# Patient Record
Sex: Male | Born: 1955 | Race: White | Hispanic: No | Marital: Married | Smoking: Current every day smoker
Health system: Southern US, Community
[De-identification: ages and names within clinical notes are randomized; demographics above are authoritative.]

## PROBLEM LIST (undated history)

## (undated) DIAGNOSIS — D126 Benign neoplasm of colon, unspecified: Secondary | ICD-10-CM

## (undated) DIAGNOSIS — L409 Psoriasis, unspecified: Secondary | ICD-10-CM

## (undated) DIAGNOSIS — N400 Enlarged prostate without lower urinary tract symptoms: Secondary | ICD-10-CM

## (undated) HISTORY — DX: Benign prostatic hyperplasia without lower urinary tract symptoms: N40.0

## (undated) HISTORY — DX: Psoriasis, unspecified: L40.9

## (undated) HISTORY — DX: Benign neoplasm of colon, unspecified: D12.6

---

## 2004-02-10 ENCOUNTER — Other Ambulatory Visit: Payer: Self-pay

## 2004-02-11 ENCOUNTER — Other Ambulatory Visit: Payer: Self-pay

## 2008-03-09 DIAGNOSIS — D126 Benign neoplasm of colon, unspecified: Secondary | ICD-10-CM

## 2008-03-09 HISTORY — DX: Benign neoplasm of colon, unspecified: D12.6

## 2008-03-09 HISTORY — PX: COLONOSCOPY: SHX174

## 2008-03-12 ENCOUNTER — Ambulatory Visit: Payer: Self-pay | Admitting: Gastroenterology

## 2008-03-12 LAB — HM COLONOSCOPY: HM Colonoscopy: NORMAL

## 2011-04-01 ENCOUNTER — Ambulatory Visit: Payer: Self-pay | Admitting: Urology

## 2011-06-24 ENCOUNTER — Ambulatory Visit: Payer: Self-pay | Admitting: Urology

## 2011-07-06 ENCOUNTER — Ambulatory Visit: Payer: Self-pay | Admitting: Urology

## 2011-07-14 ENCOUNTER — Ambulatory Visit: Payer: Self-pay | Admitting: Urology

## 2011-07-16 ENCOUNTER — Ambulatory Visit: Payer: Self-pay | Admitting: Urology

## 2011-07-23 ENCOUNTER — Ambulatory Visit: Payer: Self-pay | Admitting: Urology

## 2011-07-30 ENCOUNTER — Ambulatory Visit: Payer: Self-pay | Admitting: Urology

## 2011-08-06 ENCOUNTER — Ambulatory Visit: Payer: Self-pay | Admitting: Urology

## 2011-08-10 ENCOUNTER — Ambulatory Visit: Payer: Self-pay | Admitting: Urology

## 2011-08-18 ENCOUNTER — Ambulatory Visit: Payer: Self-pay | Admitting: Urology

## 2016-03-16 DIAGNOSIS — L409 Psoriasis, unspecified: Secondary | ICD-10-CM | POA: Insufficient documentation

## 2016-03-16 DIAGNOSIS — N400 Enlarged prostate without lower urinary tract symptoms: Secondary | ICD-10-CM | POA: Insufficient documentation

## 2016-04-14 ENCOUNTER — Encounter: Payer: Self-pay | Admitting: Family Medicine

## 2016-04-14 ENCOUNTER — Ambulatory Visit (INDEPENDENT_AMBULATORY_CARE_PROVIDER_SITE_OTHER): Payer: 59 | Admitting: Family Medicine

## 2016-04-14 VITALS — BP 132/86 | HR 78 | Temp 98.8°F | Ht 69.6 in | Wt 182.0 lb

## 2016-04-14 DIAGNOSIS — Z0000 Encounter for general adult medical examination without abnormal findings: Secondary | ICD-10-CM | POA: Diagnosis not present

## 2016-04-14 LAB — URINALYSIS, ROUTINE W REFLEX MICROSCOPIC
BILIRUBIN UA: NEGATIVE
Glucose, UA: NEGATIVE
Ketones, UA: NEGATIVE
LEUKOCYTES UA: NEGATIVE
Nitrite, UA: NEGATIVE
PH UA: 5.5 (ref 5.0–7.5)
Specific Gravity, UA: 1.025 (ref 1.005–1.030)
Urobilinogen, Ur: 1 mg/dL (ref 0.2–1.0)

## 2016-04-14 LAB — MICROSCOPIC EXAMINATION: Epithelial Cells (non renal): NONE SEEN /hpf (ref 0–10)

## 2016-04-14 MED ORDER — SILDENAFIL CITRATE 20 MG PO TABS: 20.0000 mg | ORAL_TABLET | Freq: Every day | ORAL | Status: DC | PRN

## 2016-04-14 MED ORDER — SILDENAFIL CITRATE 20 MG PO TABS
20.0000 mg | ORAL_TABLET | Freq: Every day | ORAL | Status: DC | PRN
Start: 2016-04-14 — End: 2016-04-14

## 2016-04-14 NOTE — Progress Notes (Signed)
BP 132/86 mmHg  Pulse 78  Temp(Src) 98.8 F (37.1 C)  Ht 5' 9.6" (1.768 m)  Wt 182 lb (82.555 kg)  BMI 26.41 kg/m2  SpO2 96%   Subjective:    Patient ID: Trevor Lopez, male    DOB: 02-25-1956, 60 y.o.   MRN: [id number]  HPI: Trevor Lopez is a 60 y.o. male  Chief Complaint  Patient presents with  . Annual Exam  Patient with 2 episodes of bad bronchitis this winter is come to the realization's time to quit smoking. His biggest obstacle now is his wife who continues to smoke at home in the house. He is now down to 2 cigarettes a day doing well. We discussed Chantix had some bad vivid dreams previously on that medication.   Relevant past medical, surgical, family and social history reviewed and updated as indicated. Interim medical history since our last visit reviewed. Allergies and medications reviewed and updated.  Review of Systems  Constitutional: Negative.   HENT: Negative.   Eyes: Negative.   Respiratory: Positive for cough. Negative for shortness of breath.   Cardiovascular: Negative.  Negative for chest pain and palpitations.  Gastrointestinal: Negative.   Endocrine: Negative.   Genitourinary: Negative.   Musculoskeletal: Negative.   Skin: Negative.   Allergic/Immunologic: Negative.   Neurological: Negative.   Hematological: Negative.   Psychiatric/Behavioral: Negative.     Per HPI unless specifically indicated above     Objective:    BP 132/86 mmHg  Pulse 78  Temp(Src) 98.8 F (37.1 C)  Ht 5' 9.6" (1.768 m)  Wt 182 lb (82.555 kg)  BMI 26.41 kg/m2  SpO2 96%  Wt Readings from Last 3 Encounters:  04/14/16 182 lb (82.555 kg)  08/22/14 172 lb (78.019 kg)    Physical Exam  Constitutional: He is oriented to person, place, and time. He appears well-developed and well-nourished.  HENT:  Head: Normocephalic and atraumatic.  Right Ear: External ear normal.  Left Ear: External ear normal.  Eyes: Conjunctivae and EOM are normal. Pupils are equal, round, and  reactive to light.  Neck: Normal range of motion. Neck supple.  Cardiovascular: Normal rate, regular rhythm, normal heart sounds and intact distal pulses.   Pulmonary/Chest: Effort normal and breath sounds normal.  Abdominal: Soft. Bowel sounds are normal. There is no splenomegaly or hepatomegaly.  Genitourinary: Rectum normal, prostate normal and penis normal.  Musculoskeletal: Normal range of motion.  Neurological: He is alert and oriented to person, place, and time. He has normal reflexes.  Skin: No rash noted. No erythema.  Psychiatric: He has a normal mood and affect. His behavior is normal. Judgment and thought content normal.    Results for orders placed or performed in visit on 03/16/16  [facility] COLONOSCOPY  Result Value Ref Range   [facility] Colonoscopy Patient Reported Normal See Report, Patient Reported Normal      Assessment & Plan:   Problem List Items Addressed This Visit    None    Visit Diagnoses    Routine general medical examination at a health care facility    -  Primary    Relevant Orders    CBC with Differential/Platelet    Comprehensive metabolic panel    Lipid Panel w/o Chol/HDL Ratio    PSA    TSH    Urinalysis, Routine w reflex microscopic (not at [facility])      discussed smoking and stopping  Follow up plan: Return if symptoms worsen or fail to improve.

## 2016-04-15 ENCOUNTER — Telehealth: Payer: Self-pay | Admitting: Family Medicine

## 2016-04-15 DIAGNOSIS — R7989 Other specified abnormal findings of blood chemistry: Secondary | ICD-10-CM

## 2016-04-15 DIAGNOSIS — R972 Elevated prostate specific antigen [PSA]: Secondary | ICD-10-CM

## 2016-04-15 DIAGNOSIS — D751 Secondary polycythemia: Secondary | ICD-10-CM

## 2016-04-15 LAB — COMPREHENSIVE METABOLIC PANEL
A/G RATIO: 1.5 (ref 1.2–2.2)
ALK PHOS: 114 IU/L (ref 39–117)
ALT: 41 IU/L (ref 0–44)
AST: 29 IU/L (ref 0–40)
Albumin: 4.1 g/dL (ref 3.5–5.5)
BUN/Creatinine Ratio: 9 (ref 9–20)
BUN: 9 mg/dL (ref 6–24)
Bilirubin Total: 0.7 mg/dL (ref 0.0–1.2)
CALCIUM: 9.5 mg/dL (ref 8.7–10.2)
CHLORIDE: 98 mmol/L (ref 96–106)
CO2: 22 mmol/L (ref 18–29)
Creatinine, Ser: 0.95 mg/dL (ref 0.76–1.27)
GFR calc Af Amer: 101 mL/min/{1.73_m2} (ref >59)
GFR, EST NON AFRICAN AMERICAN: 87 mL/min/{1.73_m2} (ref >59)
GLOBULIN, TOTAL: 2.7 g/dL (ref 1.5–4.5)
Glucose: 95 mg/dL (ref 65–99)
POTASSIUM: 4.7 mmol/L (ref 3.5–5.2)
SODIUM: 140 mmol/L (ref 134–144)
Total Protein: 6.8 g/dL (ref 6.0–8.5)

## 2016-04-15 LAB — CBC WITH DIFFERENTIAL/PLATELET
BASOS: 0 %
Basophils Absolute: 0 10*3/uL (ref 0.0–0.2)
EOS (ABSOLUTE): 0.2 10*3/uL (ref 0.0–0.4)
Eos: 2 %
HEMATOCRIT: 51 % (ref 37.5–51.0)
Hemoglobin: 18.1 g/dL — ABNORMAL HIGH (ref 12.6–17.7)
IMMATURE GRANULOCYTES: 0 %
Immature Grans (Abs): 0 10*3/uL (ref 0.0–0.1)
LYMPHS ABS: 1.7 10*3/uL (ref 0.7–3.1)
Lymphs: 25 %
MCH: 35.7 pg — ABNORMAL HIGH (ref 26.6–33.0)
MCHC: 35.5 g/dL (ref 31.5–35.7)
MCV: 101 fL — AB (ref 79–97)
MONOS ABS: 0.8 10*3/uL (ref 0.1–0.9)
Monocytes: 13 %
NEUTROS PCT: 60 %
Neutrophils Absolute: 4 10*3/uL (ref 1.4–7.0)
PLATELETS: 204 10*3/uL (ref 150–379)
RBC: 5.07 x10E6/uL (ref 4.14–5.80)
RDW: 15.8 % — AB (ref 12.3–15.4)
WBC: 6.7 10*3/uL (ref 3.4–10.8)

## 2016-04-15 LAB — LIPID PANEL W/O CHOL/HDL RATIO
CHOLESTEROL TOTAL: 145 mg/dL (ref 100–199)
HDL: 43 mg/dL (ref >39)
LDL Calculated: 75 mg/dL (ref 0–99)
TRIGLYCERIDES: 137 mg/dL (ref 0–149)
VLDL Cholesterol Cal: 27 mg/dL (ref 5–40)

## 2016-04-15 LAB — PSA: Prostate Specific Ag, Serum: 5.1 ng/mL — ABNORMAL HIGH (ref 0.0–4.0)

## 2016-04-15 LAB — TSH: TSH: <0.006 u[IU]/mL — ABNORMAL LOW (ref 0.450–4.500)

## 2016-04-15 NOTE — Telephone Encounter (Signed)
Phone call Discussed with patient elevated hemoglobin. Discussed ramifications with smoking and need for smoking cessation smoking outside and secondhand smoke patient indicates he understands and will try to comply Elevated PSA no prostate symptoms Suppressed TSH no thyroid symptoms Will need to recheck PSA, TSH, CBC 1 month

## 2016-05-08 ENCOUNTER — Other Ambulatory Visit: Payer: 59

## 2016-05-08 DIAGNOSIS — R7989 Other specified abnormal findings of blood chemistry: Secondary | ICD-10-CM

## 2016-05-08 DIAGNOSIS — R972 Elevated prostate specific antigen [PSA]: Secondary | ICD-10-CM

## 2016-05-08 DIAGNOSIS — D751 Secondary polycythemia: Secondary | ICD-10-CM

## 2016-05-09 LAB — CBC WITH DIFFERENTIAL/PLATELET
Basophils Absolute: 0 10*3/uL (ref 0.0–0.2)
Basos: 1 %
EOS (ABSOLUTE): 0.2 10*3/uL (ref 0.0–0.4)
EOS: 3 %
HEMATOCRIT: 52.9 % — AB (ref 37.5–51.0)
HEMOGLOBIN: 18.2 g/dL — AB (ref 12.6–17.7)
Immature Grans (Abs): 0 10*3/uL (ref 0.0–0.1)
Immature Granulocytes: 0 %
LYMPHS ABS: 1.8 10*3/uL (ref 0.7–3.1)
Lymphs: 31 %
MCH: 35.9 pg — AB (ref 26.6–33.0)
MCHC: 34.4 g/dL (ref 31.5–35.7)
MCV: 104 fL — ABNORMAL HIGH (ref 79–97)
MONOCYTES: 14 %
Monocytes Absolute: 0.8 10*3/uL (ref 0.1–0.9)
NEUTROS ABS: 2.9 10*3/uL (ref 1.4–7.0)
Neutrophils: 51 %
Platelets: 196 10*3/uL (ref 150–379)
RBC: 5.07 x10E6/uL (ref 4.14–5.80)
RDW: 15.6 % — ABNORMAL HIGH (ref 12.3–15.4)
WBC: 5.7 10*3/uL (ref 3.4–10.8)

## 2016-05-09 LAB — TSH: TSH: <0.006 u[IU]/mL — ABNORMAL LOW (ref 0.450–4.500)

## 2016-05-09 LAB — PSA: Prostate Specific Ag, Serum: 4 ng/mL (ref 0.0–4.0)

## 2016-05-11 ENCOUNTER — Telehealth: Payer: Self-pay | Admitting: Family Medicine

## 2016-05-11 DIAGNOSIS — E0590 Thyrotoxicosis, unspecified without thyrotoxic crisis or storm: Secondary | ICD-10-CM

## 2016-05-11 NOTE — Telephone Encounter (Addendum)
Will check on how long it will take to get into endocrine for his newly diagnosed hyperthyroid. If quick, will leave alone. If not, will consider starting methimazole, obtain thyroid ultrasound and NM uptake scan.

## 2016-05-13 NOTE — Telephone Encounter (Signed)
Perfect. Then I'll hold on anything further. Thanks!

## 2016-05-13 NOTE — Telephone Encounter (Signed)
Called endo. They said they have the referral and was going to call the patient in just a few minutes to schedule an appointment.

## 2016-05-13 NOTE — Telephone Encounter (Signed)
Any word on this one??

## 2016-05-18 ENCOUNTER — Encounter: Payer: Self-pay | Admitting: Family Medicine

## 2016-06-03 ENCOUNTER — Other Ambulatory Visit: Payer: Self-pay | Admitting: Internal Medicine

## 2016-06-03 DIAGNOSIS — E0590 Thyrotoxicosis, unspecified without thyrotoxic crisis or storm: Secondary | ICD-10-CM

## 2016-06-11 ENCOUNTER — Encounter
Admission: RE | Admit: 2016-06-11 | Discharge: 2016-06-11 | Disposition: A | Discharge status: Home / Self Care | Payer: 59 | Source: Ambulatory Visit | Attending: Internal Medicine | Admitting: Internal Medicine

## 2016-06-11 DIAGNOSIS — E0590 Thyrotoxicosis, unspecified without thyrotoxic crisis or storm: Secondary | ICD-10-CM | POA: Diagnosis present

## 2016-06-11 MED ORDER — SODIUM IODIDE I-123 3.7 MBQ PO CAPS
158.2000 | ORAL_CAPSULE | Freq: Once | ORAL | Status: AC
  Administered 2016-06-11: 158.2 via ORAL

## 2016-06-12 ENCOUNTER — Encounter
Admission: RE | Admit: 2016-06-12 | Discharge: 2016-06-12 | Disposition: A | Discharge status: Home / Self Care | Payer: 59 | Source: Ambulatory Visit | Attending: Internal Medicine | Admitting: Internal Medicine

## 2018-02-01 ENCOUNTER — Other Ambulatory Visit: Payer: Self-pay | Admitting: Urology

## 2018-02-01 DIAGNOSIS — N201 Calculus of ureter: Secondary | ICD-10-CM

## 2018-02-09 ENCOUNTER — Ambulatory Visit: Admission: RE | Admit: 2018-02-09 | Payer: 59 | Source: Ambulatory Visit

## 2018-02-16 ENCOUNTER — Ambulatory Visit: Admission: RE | Admit: 2018-02-16 | Payer: 59 | Source: Ambulatory Visit

## 2019-06-23 ENCOUNTER — Inpatient Hospital Stay
Admission: EM | Admit: 2019-06-23 | Discharge: 2019-07-11 | DRG: 432 | Disposition: E | Payer: 59 | Attending: Internal Medicine | Admitting: Internal Medicine

## 2019-06-23 ENCOUNTER — Inpatient Hospital Stay: Payer: 59

## 2019-06-23 ENCOUNTER — Other Ambulatory Visit: Payer: Self-pay

## 2019-06-23 ENCOUNTER — Emergency Department: Payer: 59

## 2019-06-23 DIAGNOSIS — K922 Gastrointestinal hemorrhage, unspecified: Secondary | ICD-10-CM | POA: Diagnosis not present

## 2019-06-23 DIAGNOSIS — E872 Acidosis: Secondary | ICD-10-CM | POA: Diagnosis present

## 2019-06-23 DIAGNOSIS — Y92009 Unspecified place in unspecified non-institutional (private) residence as the place of occurrence of the external cause: Secondary | ICD-10-CM

## 2019-06-23 DIAGNOSIS — I8511 Secondary esophageal varices with bleeding: Secondary | ICD-10-CM | POA: Diagnosis present

## 2019-06-23 DIAGNOSIS — Z79899 Other long term (current) drug therapy: Secondary | ICD-10-CM

## 2019-06-23 DIAGNOSIS — E162 Hypoglycemia, unspecified: Secondary | ICD-10-CM | POA: Diagnosis present

## 2019-06-23 DIAGNOSIS — R34 Anuria and oliguria: Secondary | ICD-10-CM | POA: Diagnosis present

## 2019-06-23 DIAGNOSIS — R296 Repeated falls: Secondary | ICD-10-CM | POA: Diagnosis present

## 2019-06-23 DIAGNOSIS — Z66 Do not resuscitate: Secondary | ICD-10-CM | POA: Diagnosis not present

## 2019-06-23 DIAGNOSIS — J9601 Acute respiratory failure with hypoxia: Secondary | ICD-10-CM | POA: Diagnosis present

## 2019-06-23 DIAGNOSIS — K7030 Alcoholic cirrhosis of liver without ascites: Principal | ICD-10-CM | POA: Diagnosis present

## 2019-06-23 DIAGNOSIS — R402434 Glasgow coma scale score 3-8, 24 hours or more after hospital admission: Secondary | ICD-10-CM | POA: Diagnosis not present

## 2019-06-23 DIAGNOSIS — T829XXA Unspecified complication of cardiac and vascular prosthetic device, implant and graft, initial encounter: Secondary | ICD-10-CM

## 2019-06-23 DIAGNOSIS — E039 Hypothyroidism, unspecified: Secondary | ICD-10-CM | POA: Diagnosis present

## 2019-06-23 DIAGNOSIS — N170 Acute kidney failure with tubular necrosis: Secondary | ICD-10-CM | POA: Diagnosis present

## 2019-06-23 DIAGNOSIS — R571 Hypovolemic shock: Secondary | ICD-10-CM | POA: Diagnosis present

## 2019-06-23 DIAGNOSIS — G9341 Metabolic encephalopathy: Secondary | ICD-10-CM | POA: Diagnosis present

## 2019-06-23 DIAGNOSIS — E875 Hyperkalemia: Secondary | ICD-10-CM | POA: Diagnosis present

## 2019-06-23 DIAGNOSIS — N289 Disorder of kidney and ureter, unspecified: Secondary | ICD-10-CM | POA: Diagnosis not present

## 2019-06-23 DIAGNOSIS — N138 Other obstructive and reflux uropathy: Secondary | ICD-10-CM | POA: Diagnosis present

## 2019-06-23 DIAGNOSIS — W19XXXA Unspecified fall, initial encounter: Secondary | ICD-10-CM | POA: Diagnosis present

## 2019-06-23 DIAGNOSIS — Z515 Encounter for palliative care: Secondary | ICD-10-CM | POA: Diagnosis present

## 2019-06-23 DIAGNOSIS — D62 Acute posthemorrhagic anemia: Secondary | ICD-10-CM | POA: Diagnosis present

## 2019-06-23 DIAGNOSIS — N179 Acute kidney failure, unspecified: Secondary | ICD-10-CM

## 2019-06-23 DIAGNOSIS — G8929 Other chronic pain: Secondary | ICD-10-CM | POA: Diagnosis present

## 2019-06-23 DIAGNOSIS — F10239 Alcohol dependence with withdrawal, unspecified: Secondary | ICD-10-CM | POA: Diagnosis present

## 2019-06-23 DIAGNOSIS — N401 Enlarged prostate with lower urinary tract symptoms: Secondary | ICD-10-CM | POA: Diagnosis present

## 2019-06-23 DIAGNOSIS — R578 Other shock: Secondary | ICD-10-CM | POA: Diagnosis present

## 2019-06-23 DIAGNOSIS — D684 Acquired coagulation factor deficiency: Secondary | ICD-10-CM | POA: Diagnosis present

## 2019-06-23 DIAGNOSIS — E871 Hypo-osmolality and hyponatremia: Secondary | ICD-10-CM | POA: Diagnosis present

## 2019-06-23 DIAGNOSIS — K7010 Alcoholic hepatitis without ascites: Secondary | ICD-10-CM | POA: Diagnosis present

## 2019-06-23 DIAGNOSIS — J9690 Respiratory failure, unspecified, unspecified whether with hypoxia or hypercapnia: Secondary | ICD-10-CM

## 2019-06-23 DIAGNOSIS — T68XXXA Hypothermia, initial encounter: Secondary | ICD-10-CM | POA: Diagnosis present

## 2019-06-23 DIAGNOSIS — D6959 Other secondary thrombocytopenia: Secondary | ICD-10-CM | POA: Diagnosis present

## 2019-06-23 DIAGNOSIS — I509 Heart failure, unspecified: Secondary | ICD-10-CM | POA: Diagnosis present

## 2019-06-23 DIAGNOSIS — K7211 Chronic hepatic failure with coma: Secondary | ICD-10-CM | POA: Diagnosis not present

## 2019-06-23 DIAGNOSIS — Z820 Family history of epilepsy and other diseases of the nervous system: Secondary | ICD-10-CM

## 2019-06-23 DIAGNOSIS — R7989 Other specified abnormal findings of blood chemistry: Secondary | ICD-10-CM

## 2019-06-23 DIAGNOSIS — F17210 Nicotine dependence, cigarettes, uncomplicated: Secondary | ICD-10-CM | POA: Diagnosis present

## 2019-06-23 DIAGNOSIS — M545 Low back pain: Secondary | ICD-10-CM | POA: Diagnosis present

## 2019-06-23 DIAGNOSIS — R579 Shock, unspecified: Secondary | ICD-10-CM | POA: Diagnosis not present

## 2019-06-23 DIAGNOSIS — I469 Cardiac arrest, cause unspecified: Secondary | ICD-10-CM

## 2019-06-23 DIAGNOSIS — K920 Hematemesis: Secondary | ICD-10-CM | POA: Diagnosis present

## 2019-06-23 DIAGNOSIS — Z20828 Contact with and (suspected) exposure to other viral communicable diseases: Secondary | ICD-10-CM | POA: Diagnosis present

## 2019-06-23 DIAGNOSIS — K7040 Alcoholic hepatic failure without coma: Secondary | ICD-10-CM | POA: Diagnosis not present

## 2019-06-23 DIAGNOSIS — Z8249 Family history of ischemic heart disease and other diseases of the circulatory system: Secondary | ICD-10-CM

## 2019-06-23 DIAGNOSIS — D72829 Elevated white blood cell count, unspecified: Secondary | ICD-10-CM | POA: Diagnosis present

## 2019-06-23 DIAGNOSIS — Z9911 Dependence on respirator [ventilator] status: Secondary | ICD-10-CM | POA: Diagnosis not present

## 2019-06-23 DIAGNOSIS — R945 Abnormal results of liver function studies: Secondary | ICD-10-CM | POA: Diagnosis not present

## 2019-06-23 DIAGNOSIS — K7031 Alcoholic cirrhosis of liver with ascites: Secondary | ICD-10-CM | POA: Diagnosis not present

## 2019-06-23 DIAGNOSIS — K7201 Acute and subacute hepatic failure with coma: Secondary | ICD-10-CM | POA: Diagnosis not present

## 2019-06-23 DIAGNOSIS — Z7189 Other specified counseling: Secondary | ICD-10-CM | POA: Diagnosis not present

## 2019-06-23 LAB — CBC
HCT: 43.5 % (ref 39.0–52.0)
Hemoglobin: 15.1 g/dL (ref 13.0–17.0)
MCH: 32.5 pg (ref 26.0–34.0)
MCHC: 34.7 g/dL (ref 30.0–36.0)
MCV: 93.8 fL (ref 80.0–100.0)
Platelets: 128 10*3/uL — ABNORMAL LOW (ref 150–400)
RBC: 4.64 MIL/uL (ref 4.22–5.81)
RDW: 19.1 % — ABNORMAL HIGH (ref 11.5–15.5)
WBC: 10.6 10*3/uL — ABNORMAL HIGH (ref 4.0–10.5)
nRBC: 1.4 % — ABNORMAL HIGH (ref 0.0–0.2)

## 2019-06-23 LAB — PROTIME-INR
INR: 2.3 — ABNORMAL HIGH (ref 0.8–1.2)
INR: 1.6 — ABNORMAL HIGH (ref 0.8–1.2)
Prothrombin Time: 24.7 seconds — ABNORMAL HIGH (ref 11.4–15.2)
Prothrombin Time: 19 seconds — ABNORMAL HIGH (ref 11.4–15.2)

## 2019-06-23 LAB — BLOOD GAS, ARTERIAL
Acid-base deficit: 19.7 mmol/L — ABNORMAL HIGH (ref 0.0–2.0)
Acid-base deficit: 13.1 mmol/L — ABNORMAL HIGH (ref 0.0–2.0)
Acid-base deficit: 5.9 mmol/L — ABNORMAL HIGH (ref 0.0–2.0)
Bicarbonate: 11.1 mmol/L — ABNORMAL LOW (ref 20.0–28.0)
Bicarbonate: 14.1 mmol/L — ABNORMAL LOW (ref 20.0–28.0)
Bicarbonate: 18.6 mmol/L — ABNORMAL LOW (ref 20.0–28.0)
FIO2: 100
FIO2: 0.4
FIO2: 0.4
MECHVT: 500 mL
MECHVT: 500 mL
MECHVT: 500 mL
Mechanical Rate: 30
O2 Saturation: 99.9 %
O2 Saturation: 90.3 %
O2 Saturation: 92.8 %
PEEP: 5 cmH2O
PEEP: 5 cmH2O
PEEP: 5 cmH2O
Patient temperature: 37
Patient temperature: 37
Patient temperature: 37
RATE: 30 resp/min
RATE: 30 resp/min
pCO2 arterial: 44 mmHg (ref 32.0–48.0)
pCO2 arterial: 36 mmHg (ref 32.0–48.0)
pCO2 arterial: 33 mmHg (ref 32.0–48.0)
pH, Arterial: 7.01 — CL (ref 7.350–7.450)
pH, Arterial: 7.2 — ABNORMAL LOW (ref 7.350–7.450)
pH, Arterial: 7.36 (ref 7.350–7.450)
pO2, Arterial: 446 mmHg — ABNORMAL HIGH (ref 83.0–108.0)
pO2, Arterial: 73 mmHg — ABNORMAL LOW (ref 83.0–108.0)
pO2, Arterial: 69 mmHg — ABNORMAL LOW (ref 83.0–108.0)

## 2019-06-23 LAB — CBC WITH DIFFERENTIAL/PLATELET
Abs Immature Granulocytes: 1.5 10*3/uL — ABNORMAL HIGH (ref 0.00–0.07)
Basophils Absolute: 0 10*3/uL (ref 0.0–0.1)
Basophils Relative: 0 %
Eosinophils Absolute: 0 10*3/uL (ref 0.0–0.5)
Eosinophils Relative: 0 %
HCT: 20.5 % — ABNORMAL LOW (ref 39.0–52.0)
Hemoglobin: 7.1 g/dL — ABNORMAL LOW (ref 13.0–17.0)
Lymphocytes Relative: 20 %
Lymphs Abs: 3.4 10*3/uL (ref 0.7–4.0)
MCH: 40.3 pg — ABNORMAL HIGH (ref 26.0–34.0)
MCHC: 34.6 g/dL (ref 30.0–36.0)
MCV: 116.5 fL — ABNORMAL HIGH (ref 80.0–100.0)
Metamyelocytes Relative: 1 %
Monocytes Absolute: 0 10*3/uL — ABNORMAL LOW (ref 0.1–1.0)
Monocytes Relative: 0 %
Myelocytes: 8 %
Neutro Abs: 10.7 10*3/uL — ABNORMAL HIGH (ref 1.7–7.7)
Neutrophils Relative %: 63 %
Other: 8 %
Platelets: 239 10*3/uL (ref 150–400)
RBC: 1.76 MIL/uL — ABNORMAL LOW (ref 4.22–5.81)
RDW: 26.7 % — ABNORMAL HIGH (ref 11.5–15.5)
Smear Review: ADEQUATE
WBC: 17 10*3/uL — ABNORMAL HIGH (ref 4.0–10.5)
nRBC: 4 /100 WBC — ABNORMAL HIGH

## 2019-06-23 LAB — COMPREHENSIVE METABOLIC PANEL
ALT: 50 U/L — ABNORMAL HIGH (ref 0–44)
ALT: 77 U/L — ABNORMAL HIGH (ref 0–44)
AST: 160 U/L — ABNORMAL HIGH (ref 15–41)
AST: 258 U/L — ABNORMAL HIGH (ref 15–41)
Albumin: 1.6 g/dL — ABNORMAL LOW (ref 3.5–5.0)
Albumin: 2.3 g/dL — ABNORMAL LOW (ref 3.5–5.0)
Alkaline Phosphatase: 228 U/L — ABNORMAL HIGH (ref 38–126)
Alkaline Phosphatase: 218 U/L — ABNORMAL HIGH (ref 38–126)
Anion gap: 27 — ABNORMAL HIGH (ref 5–15)
Anion gap: 23 — ABNORMAL HIGH (ref 5–15)
BUN: 131 mg/dL — ABNORMAL HIGH (ref 8–23)
BUN: 113 mg/dL — ABNORMAL HIGH (ref 8–23)
CO2: 11 mmol/L — ABNORMAL LOW (ref 22–32)
CO2: 15 mmol/L — ABNORMAL LOW (ref 22–32)
Calcium: 9.6 mg/dL (ref 8.9–10.3)
Calcium: 8.5 mg/dL — ABNORMAL LOW (ref 8.9–10.3)
Chloride: 89 mmol/L — ABNORMAL LOW (ref 98–111)
Chloride: 90 mmol/L — ABNORMAL LOW (ref 98–111)
Creatinine, Ser: 8.23 mg/dL — ABNORMAL HIGH (ref 0.61–1.24)
Creatinine, Ser: 6.74 mg/dL — ABNORMAL HIGH (ref 0.61–1.24)
GFR calc Af Amer: 7 mL/min — ABNORMAL LOW (ref >60)
GFR calc Af Amer: 9 mL/min — ABNORMAL LOW (ref >60)
GFR calc non Af Amer: 6 mL/min — ABNORMAL LOW (ref >60)
GFR calc non Af Amer: 8 mL/min — ABNORMAL LOW (ref >60)
Glucose, Bld: 49 mg/dL — ABNORMAL LOW (ref 70–99)
Glucose, Bld: 140 mg/dL — ABNORMAL HIGH (ref 70–99)
Potassium: 6.3 mmol/L (ref 3.5–5.1)
Potassium: 5.7 mmol/L — ABNORMAL HIGH (ref 3.5–5.1)
Sodium: 127 mmol/L — ABNORMAL LOW (ref 135–145)
Sodium: 128 mmol/L — ABNORMAL LOW (ref 135–145)
Total Bilirubin: 25.9 mg/dL (ref 0.3–1.2)
Total Bilirubin: 29.1 mg/dL (ref 0.3–1.2)
Total Protein: 4.9 g/dL — ABNORMAL LOW (ref 6.5–8.1)
Total Protein: 6.1 g/dL — ABNORMAL LOW (ref 6.5–8.1)

## 2019-06-23 LAB — PATHOLOGIST SMEAR REVIEW

## 2019-06-23 LAB — GLUCOSE, CAPILLARY
Glucose-Capillary: 63 mg/dL — ABNORMAL LOW (ref 70–99)
Glucose-Capillary: 143 mg/dL — ABNORMAL HIGH (ref 70–99)
Glucose-Capillary: 178 mg/dL — ABNORMAL HIGH (ref 70–99)
Glucose-Capillary: 173 mg/dL — ABNORMAL HIGH (ref 70–99)

## 2019-06-23 LAB — APTT
aPTT: 63 seconds — ABNORMAL HIGH (ref 24–36)
aPTT: 45 seconds — ABNORMAL HIGH (ref 24–36)

## 2019-06-23 LAB — FIBRINOGEN: Fibrinogen: 315 mg/dL (ref 210–475)

## 2019-06-23 LAB — TROPONIN I (HIGH SENSITIVITY): Troponin I (High Sensitivity): 204 ng/L (ref <18)

## 2019-06-23 LAB — ABO/RH: ABO/RH(D): O POS

## 2019-06-23 LAB — TECHNOLOGIST SMEAR REVIEW

## 2019-06-23 LAB — FIBRIN DERIVATIVES D-DIMER (ARMC ONLY): Fibrin derivatives D-dimer (ARMC): >7500 ng/mL (FEU) — ABNORMAL HIGH (ref 0.00–499.00)

## 2019-06-23 LAB — MASSIVE TRANSFUSION PROTOCOL ORDER (BLOOD BANK NOTIFICATION)

## 2019-06-23 LAB — SARS CORONAVIRUS 2 BY RT PCR (HOSPITAL ORDER, PERFORMED IN ~~LOC~~ HOSPITAL LAB): SARS Coronavirus 2: NEGATIVE

## 2019-06-23 LAB — AMMONIA: Ammonia: 51 umol/L — ABNORMAL HIGH (ref 9–35)

## 2019-06-23 LAB — LACTIC ACID, PLASMA: Lactic Acid, Venous: UNDETERMINED mmol/L (ref 0.5–1.9)

## 2019-06-23 MED ORDER — CHLORHEXIDINE GLUCONATE 0.12% ORAL RINSE (MEDLINE KIT)
15.0000 mL | Freq: Two times a day (BID) | OROMUCOSAL | Status: DC
  Administered 2019-06-23 – 2019-06-30 (×14): 15 mL via OROMUCOSAL

## 2019-06-23 MED ORDER — FENTANYL CITRATE (PF) 100 MCG/2ML IJ SOLN: 50.0000 ug | INTRAMUSCULAR | Status: DC | PRN

## 2019-06-23 MED ORDER — MIDAZOLAM HCL 2 MG/2ML IJ SOLN
2.0000 mg | INTRAMUSCULAR | Status: AC | PRN
  Administered 2019-06-23 (×3): 2 mg via INTRAVENOUS
  Filled 2019-06-23 (×3): qty 2

## 2019-06-23 MED ORDER — SODIUM CHLORIDE 0.9 % IV SOLN
2.0000 g | Freq: Once | INTRAVENOUS | Status: AC
  Administered 2019-06-23: 2 g via INTRAVENOUS
  Filled 2019-06-23: qty 20

## 2019-06-23 MED ORDER — FENTANYL CITRATE (PF) 100 MCG/2ML IJ SOLN
50.0000 ug | INTRAMUSCULAR | Status: DC | PRN
  Administered 2019-06-23 – 2019-06-24 (×8): 100 ug via INTRAVENOUS
  Filled 2019-06-23 (×8): qty 2

## 2019-06-23 MED ORDER — PANTOPRAZOLE SODIUM 40 MG IV SOLR
40.0000 mg | Freq: Once | INTRAVENOUS | Status: AC
  Administered 2019-06-23: 40 mg via INTRAVENOUS
  Filled 2019-06-23: qty 40

## 2019-06-23 MED ORDER — PANTOPRAZOLE SODIUM 40 MG IV SOLR: 40.0000 mg | Freq: Two times a day (BID) | INTRAVENOUS | Status: DC

## 2019-06-23 MED ORDER — CHLORHEXIDINE GLUCONATE CLOTH 2 % EX PADS
6.0000 | MEDICATED_PAD | Freq: Every day | CUTANEOUS | Status: DC
  Administered 2019-06-25 – 2019-06-29 (×5): 6 via TOPICAL

## 2019-06-23 MED ORDER — SODIUM CHLORIDE 0.9 % IV SOLN
8.0000 mg/h | INTRAVENOUS | Status: DC
  Administered 2019-06-23 – 2019-06-24 (×2): 8 mg/h via INTRAVENOUS
  Filled 2019-06-23 (×2): qty 80

## 2019-06-23 MED ORDER — SODIUM CHLORIDE 0.9% IV SOLUTION: Freq: Once | INTRAVENOUS | Status: DC

## 2019-06-23 MED ORDER — ONDANSETRON HCL 4 MG PO TABS: 4.0000 mg | ORAL_TABLET | Freq: Four times a day (QID) | ORAL | Status: DC | PRN

## 2019-06-23 MED ORDER — ORAL CARE MOUTH RINSE
15.0000 mL | OROMUCOSAL | Status: DC
  Administered 2019-06-24 – 2019-06-30 (×64): 15 mL via OROMUCOSAL

## 2019-06-23 MED ORDER — MIDAZOLAM HCL 2 MG/2ML IJ SOLN
2.0000 mg | INTRAMUSCULAR | Status: DC | PRN
  Administered 2019-06-23 – 2019-06-24 (×5): 2 mg via INTRAVENOUS
  Filled 2019-06-23 (×5): qty 2

## 2019-06-23 MED ORDER — OCTREOTIDE LOAD VIA INFUSION
100.0000 ug | Freq: Once | INTRAVENOUS | Status: AC
  Administered 2019-06-23: 100 ug via INTRAVENOUS
  Filled 2019-06-23: qty 50

## 2019-06-23 MED ORDER — ONDANSETRON HCL 4 MG/2ML IJ SOLN: 4.0000 mg | Freq: Four times a day (QID) | INTRAMUSCULAR | Status: DC | PRN

## 2019-06-23 MED ORDER — VASOPRESSIN 20 UNIT/ML IV SOLN
0.0300 [IU]/min | INTRAVENOUS | Status: DC
  Administered 2019-06-23: 0.04 [IU]/min via INTRAVENOUS
  Administered 2019-06-24 – 2019-06-25 (×2): 0.03 [IU]/min via INTRAVENOUS
  Filled 2019-06-23 (×3): qty 2

## 2019-06-23 MED ORDER — ALBUTEROL SULFATE (2.5 MG/3ML) 0.083% IN NEBU: 2.5000 mg | INHALATION_SOLUTION | RESPIRATORY_TRACT | Status: DC | PRN

## 2019-06-23 MED ORDER — NOREPINEPHRINE 16 MG/250ML-% IV SOLN
0.0000 ug/min | INTRAVENOUS | Status: DC
  Administered 2019-06-23: 10 ug/min via INTRAVENOUS
  Administered 2019-06-24 – 2019-06-25 (×2): 15 ug/min via INTRAVENOUS
  Administered 2019-06-26: 02:00:00 20 ug/min via INTRAVENOUS
  Administered 2019-06-26: 18:00:00 12 ug/min via INTRAVENOUS
  Administered 2019-06-27: 10 ug/min via INTRAVENOUS
  Administered 2019-06-28: 8 ug/min via INTRAVENOUS
  Administered 2019-06-29: 17 ug/min via INTRAVENOUS
  Administered 2019-06-30: 25 ug/min via INTRAVENOUS
  Filled 2019-06-23 (×9): qty 250

## 2019-06-23 MED ORDER — VITAMIN K1 10 MG/ML IJ SOLN
10.0000 mg | Freq: Once | INTRAVENOUS | Status: AC
  Administered 2019-06-23: 10 mg via INTRAVENOUS
  Filled 2019-06-23: qty 1

## 2019-06-23 MED ORDER — SODIUM BICARBONATE-DEXTROSE 150-5 MEQ/L-% IV SOLN
150.0000 meq | INTRAVENOUS | Status: DC
  Administered 2019-06-23 – 2019-06-24 (×2): 150 meq via INTRAVENOUS
  Filled 2019-06-23 (×7): qty 1000

## 2019-06-23 MED ORDER — SODIUM CHLORIDE 0.9 % IV SOLN
1.0000 g | INTRAVENOUS | Status: DC
  Administered 2019-06-24 – 2019-06-29 (×6): 1 g via INTRAVENOUS
  Filled 2019-06-23 (×6): qty 1

## 2019-06-23 MED ORDER — NOREPINEPHRINE BITARTRATE 1 MG/ML IV SOLN
INTRAVENOUS | Status: AC | PRN
  Administered 2019-06-23: 4 ug/kg/min via INTRAVENOUS

## 2019-06-23 MED ORDER — SODIUM CHLORIDE 0.9 % IV SOLN
50.0000 ug/h | INTRAVENOUS | Status: DC
  Administered 2019-06-23 – 2019-06-25 (×5): 50 ug/h via INTRAVENOUS
  Filled 2019-06-23 (×9): qty 1

## 2019-06-23 MED ORDER — SODIUM CHLORIDE 0.9 % IV BOLUS: 1000.0000 mL | Freq: Once | INTRAVENOUS | Status: DC

## 2019-06-23 MED ORDER — SODIUM CHLORIDE 0.9% FLUSH
3.0000 mL | Freq: Two times a day (BID) | INTRAVENOUS | Status: DC
  Administered 2019-06-23 – 2019-06-30 (×10): 3 mL via INTRAVENOUS

## 2019-06-23 MED ORDER — NOREPINEPHRINE 4 MG/250ML-% IV SOLN: 0.0000 ug/min | INTRAVENOUS | Status: DC

## 2019-06-23 NOTE — Consult Note (Signed)
Name: Trevor Lopez MRN: [id number] DOB: 03/16/1956    ADMISSION DATE:  07/02/2019 CONSULTATION DATE: 07/03/2019  REFERRING MD : Dr. 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  He presented to [facility] [place] ER on 08/14 via EMS with an acute GI bleed and unresponsiveness.  Per ER notes family reported the pt had been drinking heavily over the past 2 weeks prior to presentation.  However, he became confused and started having hallucinations on 08/12.  He has also been taking NSAID's and Goody Powders due to chronic lower back pain. EMS notified today 08/14 by pts family following pt fall at his home.  En route to the ER the pt had massive hematemesis leading to hypotension resulting in a cardiac arrest pt required CPR and 1 dose of epi.  Upon arrival to the ER pt continued to require CPR for a brief period of time prior to ROSC (3 to 4 minutes of ACLS prior to Kirby) and pt mechanically intubated.  ER labs revealed Na+ 127, K+ 6.3, chloride 89, CO2 11, glucose 49, BUN 131, creatinine 8.23, anion gap 27, alk phos 228, albumin 1.6, AST 160, ALT 50, total bilirubin 25.9, wbc 17.0, hgb 7.1, PT 24.7, INR 2.3, APTT 63, In the ER massive blood transfusion protocol initiated pt received 5 units of pRBC's, 2 units of  platelets, and 1 unit of FFP.  Blakemore tube also inserted.  Due to continued hypotension pt required vasopressin and levophed gtts.  ABG revealed severe metabolic acidosis pH 1.77/LTJ0 44/pO2 446/acid-base deficit 19.7/bicarb 11.1. Pt subsequently admitted to ICU by hospitalist team for additional workup and treatment.   Awake and dyssynchronous on the vent.    PAST MEDICAL HISTORY :   has a past medical history of BPH without urinary obstruction, Psoriasis, and Tubular adenoma of colon (May 2009).  has a past surgical history that includes Colonoscopy (May 2009). Prior to Admission medications   Not on File   No Known Allergies  FAMILY HISTORY:  family history includes Alzheimer's disease in his father; Heart disease in his maternal grandfather and paternal grandfather. SOCIAL HISTORY:  reports that he has been smoking cigarettes. He has been smoking about 0.25 packs per day. He has never used smokeless tobacco. He reports current alcohol use. He reports that he does not use drugs.  REVIEW OF SYSTEMS:   Unable to assess pt intubated   SUBJECTIVE:  Unable to assess pt intubated   VITAL SIGNS: Temp:  [90.9 F (32.7 C)-92.5 F (33.6 C)] 92.5 F (33.6 C) (08/14 1745) Pulse Rate:  [81-114] 96 (08/14 1745) Resp:  [5-30] 30 (08/14 1745) BP: (61-158)/(29-147) 100/66 (08/14 1745) SpO2:  [97 %-100 %] 97 % (08/14 1745) FiO2 (%):  [40 %-100 %] 40 % (08/14 1600) Weight:  [90.7 kg] 90.7 kg (08/14 1245)  PHYSICAL EXAMINATION:  General: acutely ill appearing male with dried blood around oropharynx and nares, mechanically intubated Neuro: sedated, follows commands, PERRL, sclera icterus  HEENT: supple, no JVD Cardiovascular: nsr, rrr, no R/G  Lungs: rhonchi with expiratory wheezes throughout, even, non labored  Abdomen: distended, obese, taut, +BS x4, Musculoskeletal: normal bulk and tone, no edema  Skin: bilateral upper extremity petechiae and generalized jaundice   Recent Labs  Lab  06/16/2019 1321  NA 127*  K 6.3*  CL 89*  CO2 11*  BUN 131*  CREATININE 8.23*  GLUCOSE 49*   Recent Labs  Lab 07/03/2019 1321  HGB 7.1*  HCT 20.5*  WBC 17.0*  PLT 239   Dg Abdomen 1 View  Result Date: 06/17/2019 CLINICAL DATA:  Check Blakemore catheter placement EXAM: ABDOMEN - 1 VIEW COMPARISON:  None. FINDINGS: Scattered large and small bowel gas is noted. catheter is noted within the mid to distal esophagus. The tip is 8 cm from the stomach. IMPRESSION: Blakemore catheter is noted in the mid to distal esophagus. Advancement is recommended. Electronically Signed   By: Inez Catalina M.D.   On: 06/18/2019 15:45   Dg Chest Port 1 View  Result Date: 06/21/2019 CLINICAL DATA:  Hypoxia EXAM: PORTABLE CHEST 1 VIEW COMPARISON:  None. FINDINGS: Endotracheal tube tip is 3.4 cm above the carina. Nasogastric tube tip and side port are below the diaphragm. No pneumothorax. There is no evident edema or consolidation. Heart size and pulmonary vascularity are normal. No adenopathy. No bone lesions. IMPRESSION: Tube positions as described without pneumothorax. No edema or consolidation. Cardiac silhouette within normal limits. Electronically Signed   By: Lowella Grip III M.D.   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) due ETOH abuse and NSAID/Goody powder use  Brief cardiac arrest secondary to hypotension in setting of acute GI bleed  Continuous telemetry unit  Prn vasopressin and levophed gtt to maintain map >65 Serial  troponins 2-D echo Cardiology consult  Acute renal failure with hyperkalemia  Metabolic acidosis  Hyponatremia secondary to hemorrhagic shock  Trend BMP Replace electrolytes as indicated  Monitor UOP  Avoid nephrotoxic medications Sodium bicarb gtt '@125'$  ml/hr  Repeat ABG in am   Leukocytosis  Trend WBC and monitor fever curve Follow cultures  Continue ceftriaxone due to possible variceal gi bleed  Acute GI bleed (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 VTE prophylaxis:  SCD's / Pharmacologic VTE prophylaxis contraindicated due to GIB  Shantal Roan S. Tukov-Yual ANP-BC Pulmonary and Critical Care Medicine  [employer] Pager [phone] or [phone]  NB: This document was prepared using Dragon voice recognition software and may include unintentional dictation errors.

## 2019-06-23 NOTE — ED Notes (Signed)
Dr. Jacqualine Code updated family

## 2019-06-23 NOTE — ED Provider Notes (Signed)
[facility] Emergency Department Provider Note    ____________________________________________   First MD Initiated Contact with Patient 06/24/2019 1315     (approximate)  I have reviewed the triage vital signs and the nursing notes.   HISTORY  Chief Complaint GI Bleeding   EM caveat unresponsive, CPR in progress  HPI Trevor Lopez is a 63 y.o. male arrived with CPR in progress, witnessed to have massive hematemesis leading to hypotension and then arrest in route to the hospital emergency traffic  Patient wife arrived, collected history later the patient has been a fairly heavy drinker for at least 2 years now, but was always a drinker prior.  Wife reports he is drinking hard liquor daily.  He was in his normal state of health except has been experiencing a lot of back pain recently and he is taking BC powder frequently as well  Today he had sudden onset of vomiting of severe and large amounts of blood.  Wife reports he is been healthy otherwise except for his chronic low back pain, mowed the yard yesterday.  He has not been around anyone known to have coronavirus he is not had a fever no one else around him has been sick.   Past Medical History:  Diagnosis Date  . BPH without urinary obstruction   . Psoriasis   . Tubular adenoma of colon May 2009   87mm    Patient Active Problem List   Diagnosis Date Noted  . Upper GI bleed 06/13/2019  . Hyperthyroidism 05/11/2016  . Tubular adenoma of colon 03/09/2008    Past Surgical History:  Procedure Laterality Date  . COLONOSCOPY  May 2009   Dr. Allen Norris; 38mm Tubular adenoma    Prior to Admission medications   Not on File    Allergies Patient has no known allergies.  Family History  Problem Relation Age of Onset  . Alzheimer's disease Father   . Heart disease Maternal Grandfather   . Heart disease Paternal Grandfather     Social History Social History   Tobacco Use  . Smoking status: Current  Every Day Smoker    Packs/day: 0.25    Types: Cigarettes  . Smokeless tobacco: Never Used  Substance Use Topics  . Alcohol use: Yes  . Drug use: No    Review of Systems    ____________________________________________   PHYSICAL EXAM:  VITAL SIGNS: ED Triage Vitals  Enc Vitals Group     BP 06/16/2019 1244 (!) 154/131     Pulse Rate 07/09/2019 1244 (!) 107     Resp 06/10/2019 1245 19     Temp 06/20/2019 1346 (!) 90.9 F (32.7 C)     Temp src --      SpO2 06/28/2019 1244 100 %     Weight 07/10/2019 1245 200 lb (90.7 kg)     Height 07/04/2019 1245 5\' 4"  (1.626 m)     Head Circumference --      Peak Flow --      Pain Score --      Pain Loc --      Pain Edu? --      Excl. in [place]? --     Constitutional: Unresponsive, CPR in progress Eyes: Conjunctivae are normal.  Midline.  Severe jaundice noted Head: Atraumatic. Nose: No congestion/rhinnorhea. Mouth/Throat: Mucous membranes are noted to have dried blood around the oropharynx. Neck: No stridor.  Cardiovascular: CPR in progress, underlying rhythm noted to be bradycardic, given epinephrine respiratory: Respirations even, unlabored  with end-tidal CO2 of approximately 25-30 with Square waveform with King airway in place Gastrointestinal: Soft and nontender. No distention is noted. Musculoskeletal: No lower extremity tenderness nor edema. Neurologic: Unable to assess.  Patient completely unresponsive Skin:  Skin is warm, dry and intact. No rash noted.  He has notable jaundice Psychiatric: Mood and affect are unable to be evaluated  ____________________________________________   LABS (all labs ordered are listed, but only abnormal results are displayed)  Labs Reviewed  COMPREHENSIVE METABOLIC PANEL - Abnormal; Notable for the following components:      Result Value   Sodium 127 (*)    Potassium 6.3 (*)    Chloride 89 (*)    CO2 11 (*)    Glucose, Bld 49 (*)    BUN 131 (*)    Creatinine, Ser 8.23 (*)    Total Protein 4.9 (*)     Albumin 1.6 (*)    AST 160 (*)    ALT 50 (*)    Alkaline Phosphatase 228 (*)    Total Bilirubin 25.9 (*)    GFR calc non Af Amer 6 (*)    GFR calc Af Amer 7 (*)    Anion gap 27 (*)    All other components within normal limits  CBC WITH DIFFERENTIAL/PLATELET - Abnormal; Notable for the following components:   WBC 17.0 (*)    RBC 1.76 (*)    Hemoglobin 7.1 (*)    HCT 20.5 (*)    MCV 116.5 (*)    MCH 40.3 (*)    RDW 26.7 (*)    nRBC 3.6 (*)    All other components within normal limits  APTT - Abnormal; Notable for the following components:   aPTT 63 (*)    All other components within normal limits  PROTIME-INR - Abnormal; Notable for the following components:   Prothrombin Time 24.7 (*)    INR 2.3 (*)    All other components within normal limits  SARS CORONAVIRUS 2 (HOSPITAL ORDER, [place] LAB)  CULTURE, BLOOD (ROUTINE X 2)  CULTURE, BLOOD (ROUTINE X 2)  URINE CULTURE  LACTIC ACID, PLASMA  LACTIC ACID, PLASMA  URINALYSIS, ROUTINE W REFLEX MICROSCOPIC  DIC (DISSEMINATED INTRAVASCULAR COAGULATION) PANEL  BLOOD GAS, ARTERIAL  HIV ANTIBODY (ROUTINE TESTING W REFLEX)  GLUCOSE, CAPILLARY  CBG MONITORING, ED  CBG MONITORING, ED  TYPE AND SCREEN  MASSIVE TRANSFUSION PROTOCOL ORDER (BLOOD BANK NOTIFICATION)  PREPARE RBC (CROSSMATCH)  PREPARE FRESH FROZEN PLASMA  PREPARE PLATELET PHERESIS  ABO/RH   ____________________________________________  EKG  Reviewed entered by me at 1640 Heart rate 105 QRS 89 QTc 420 Sinus tachycardia, nonspecific T wave abnormality, some ST depression in V3.  No obvious acute ST elevation MI.  This is a postarrest twelve-lead ____________________________________________  RADIOLOGY  Dg Chest Port 1 View  Result Date: 06/25/2019 CLINICAL DATA:  Hypoxia EXAM: PORTABLE CHEST 1 VIEW COMPARISON:  None. FINDINGS: Endotracheal tube tip is 3.4 cm above the carina. Nasogastric tube tip and side port are below the diaphragm. No  pneumothorax. There is no evident edema or consolidation. Heart size and pulmonary vascularity are normal. No adenopathy. No bone lesions. IMPRESSION: Tube positions as described without pneumothorax. No edema or consolidation. Cardiac silhouette within normal limits. Electronically Signed   By: Lowella Grip III M.D.   On: 06/22/2019 13:45    Imaging reviewed by me personally, endotracheal tube and OG tube in good position ____________________________________________   PROCEDURES  Procedure(s) performed: Intubation, central line  placement, orogastric tube placement  Procedure Name: Intubation Date/Time: 06/19/2019 1:56 PM Performed by: Delman Kitten, MD Pre-anesthesia Checklist: Patient identified, Emergency Drugs available, Suction available and Patient being monitored Preoxygenation: Pre-oxygenation with 100% oxygen Induction Type: IV induction and Rapid sequence Laryngoscope Size: Glidescope and 4 Grade View: Grade I Tube type: Subglottic suction tube Tube size: 7.0 mm Number of attempts: 1 Placement Confirmation: ETT inserted through vocal cords under direct vision,  CO2 detector,  Breath sounds checked- equal and bilateral and Positive ETCO2 Tube secured with: ETT holder Dental Injury: Teeth and Oropharynx as per pre-operative assessment  Comments: Notable     .Central Line  Date/Time: 06/18/2019 2:02 PM Performed by: Delman Kitten, MD Authorized by: Delman Kitten, MD   Consent:    Consent obtained:  Emergent situation Pre-procedure details:    Hand hygiene: Hand hygiene performed prior to insertion     All elements of maximal sterile barrier technique followed: Barrier utilized, but due to severity and need for large access, full sterile precautions were not utilized.      Skin preparation:  2% chlorhexidine   Skin preparation agent: Skin preparation agent completely dried prior to procedure   Procedure details:    Location:  R femoral   Patient position:  Trendelenburg    Procedural supplies:  Triple lumen   Catheter size:  7.5 Fr   Landmarks identified: yes     Ultrasound guidance: yes     Sterile ultrasound techniques: Sterile gel and sterile probe covers were used     Number of attempts:  1   Successful placement: yes   Post-procedure details:    Post-procedure:  Dressing applied and line sutured   Assessment:  Blood return through all ports and free fluid flow   Patient tolerance of procedure:  Tolerated well, no immediate complications Comments:     7.5 F Cortis placed   Orogastric tube placed by me.  Patient considered to potentially have varices, but also having massive hematemesis, needing mechanical ventilation and decompression of the gastrointestinal tract.  Placed carefully by me, resultant large amounts of dark blood coming up from the OG tube.  This was placed as the patient was noted to have dark blood in the posterior oropharynx as well coming up from the esophagus during intubation  Critical Care performed: Yes, see critical care note(s)   CRITICAL CARE Performed by: Delman Kitten   Total critical care time: 60 minutes  Critical care time was exclusive of separately billable procedures and treating other patients.  Critical care was necessary to treat or prevent imminent or life-threatening deterioration.  Critical care was time spent personally by me on the following activities: development of treatment plan with patient and/or surrogate as well as nursing, discussions with consultants, evaluation of patient's response to treatment, examination of patient, obtaining history from patient or surrogate, ordering and performing treatments and interventions, ordering and review of laboratory studies, ordering and review of radiographic studies, pulse oximetry and re-evaluation of patient's condition.   CRITICAL CARE Performed by: Delman Kitten   ADDED  care time: 40 minutes  Critical care time was exclusive of separately billable  procedures and treating other patients.  Critical care was necessary to treat or prevent imminent or life-threatening deterioration.  Critical care was time spent personally by me on the following activities: development of treatment plan with patient and/or surrogate as well as nursing, discussions with consultants, evaluation of patient's response to treatment, examination of patient, obtaining history from patient or surrogate,  ordering and performing treatments and interventions, ordering and review of laboratory studies, ordering and review of radiographic studies, pulse oximetry and re-evaluation of patient's condition.   Total critical care time 100 minutes.  Patient required extensive critical care, adjustment of pressors, massive transfusion, discussions with family on CODE STATUS, consultation with gastroenterology, intensive care, hospitalist service and or significant coordination of care and review of complex medical decision making. ____________________________________________   INITIAL IMPRESSION / [place] [facility] / ED COURSE  Pertinent labs & imaging results that were available during my care of the patient were reviewed by me and considered in my medical decision making (see chart for details).   Patient resuscitated briefly after arrival, return of pulses.  Positive rest.  Initiated emergency blood product, first 2 units is what appears to be massive upper GI bleeding.  Differential diagnosis and etiology is broad, Dr. Allen Norris of gastroenterology responded patient's bedside within 40 minutes.  Agrees with current treatment, massive transfusion, start octreotide, agree with Rocephin, also recommends IV pantoprazole which have all been ordered.  Patient continues to have ongoing instability with hypotension despite 4 units of blood, massive transfusion protocol including 2 additional units of red blood cells and 1 unit FFP 1 order platelets are pending.  Discussed case and care  with Dr. Darvin Neighbours and Benjamine Mola of the hospitalist service, patient will obviously need to be admitted to intensive care unit and is felt too unstable to undergo transport.  Gastroenterology services are available here, GI physician affirms the patient is currently too unstable for procedure at this point.  Continue to resuscitate, patient will obviously require ICU level care  ----------------------------------------- 1:59 PM on 07/10/2019 -----------------------------------------  Chest x-ray reviewed by me, lines and tubes in good position.  Right femoral venous introducer 7-1/2 French placed by me under ultrasound guidance.  Good return of blood flow.  Ultrasound utilized, appears to be noted in the femoral vein.  Distal right lower extremity pulse weak and thready after placement, equivalent to the left lower extremity pulse.  All pulses are weak.  No evidence of acute vascular occlusion post line placement.  Please note central line was placed under emergent precautions, due to the patient's massive GI bleeding full sterile precautions and technique were not able to be utilized due to time sensitivity and emergent nature of need for transfusions for lifesaving event   Clinical Course as of Jun 22 1458  Fri Jun 23, 2019  1457 Dr. Alva Garnet now at bedside, evaluating. Considering potential Blakemore placement. Patient now normotensive, 8 units prbcs, 1 unit FFP, platelets coming. Patient under care of ICU/hospitalist services now, ICU bed to open in about 20 minutes.    [MQ]    Clinical Course User Index [MQ] Delman Kitten, MD   ----------------------------------------- 2:05 PM on 06/13/2019 -----------------------------------------  Hospitalist, Dr. Darvin Neighbours is seen the patient  Vitals:   06/14/2019 1430 06/10/2019 1445  BP: (!) 98/50   Pulse: 90 88  Resp: 19 18  Temp:    SpO2: 100% 100%   ____________________________________________   FINAL CLINICAL IMPRESSION(S) / ED DIAGNOSES   Final diagnoses:  Acute GI bleeding  Cardiac arrest [facility])        Note:  This document was prepared using Dragon voice recognition software and may include unintentional dictation errors    Ongoing ED care, pending ICU bed assigned to Dr. Rip Harbour at this time.     Delman Kitten, MD 07/05/2019 1529

## 2019-06-23 NOTE — ED Notes (Addendum)
Peripheral line placed emergently in right femoral vein by Dr. Jacqualine Code. 7 1/2 french

## 2019-06-23 NOTE — H&P (Addendum)
[facility] NAME: Trevor Lopez    MR#:  [id number]  DATE OF BIRTH:  Aug 25, 1956  DATE OF ADMISSION:  06/20/2019  PRIMARY CARE PHYSICIAN: Guadalupe Maple, MD   REQUESTING/REFERRING PHYSICIAN: Dr. Jacqualine Code  CHIEF COMPLAINT:   Chief Complaint  Patient presents with  . GI Bleeding    HISTORY OF PRESENT ILLNESS:  Trevor Lopez  is a 63 y.o. male with a known history of psoriasis, hypothyroidism, BPH, alcohol abuse presents to the emergency room through EMS after family noticed patient collapsed and vomited blood.  In route with EMS patient had cardiac arrest and had CPR for 3 to 4 minutes and received 1 dose of epi.  Return of spontaneous circulation.  Patient was found to be hypotensive and hypothermic in the emergency room.  Went unresponsive and had to be intubated.  He did open his eyes and move his legs prior to becoming unresponsive.  Massive blood transfusion started.  5 units of packed RBC, 2 units of platelets and 1 FFP ordered.  All the labs are not back but patient is severely jaundiced. INR is 2.3. Hemoglobin 7.1 Patient has 2 peripheral IVs and a Cordis in place. GI Dr. Verl Blalock has seen the patient and spoke with family.  He has advised to stabilize patient first prior to EGD. Patient has already put out 500 mL in the suction canister with OG tube.  PAST MEDICAL HISTORY:   Past Medical History:  Diagnosis Date  . BPH without urinary obstruction   . Psoriasis   . Tubular adenoma of colon May 2009   65mm    PAST SURGICAL HISTORY:   Past Surgical History:  Procedure Laterality Date  . COLONOSCOPY  May 2009   Dr. Allen Norris; 74mm Tubular adenoma    SOCIAL HISTORY:   Social History   Tobacco Use  . Smoking status: Current Every Day Smoker    Packs/day: 0.25    Types: Cigarettes  . Smokeless tobacco: Never Used  Substance Use Topics  . Alcohol use: Yes    FAMILY HISTORY:   Family History  Problem Relation Age of Onset  .  Alzheimer's disease Father   . Heart disease Maternal Grandfather   . Heart disease Paternal Grandfather     DRUG ALLERGIES:  No Known Allergies  REVIEW OF SYSTEMS:   Review of Systems  Unable to perform ROS: Intubated    MEDICATIONS AT HOME:   Prior to Admission medications   Not on File     VITAL SIGNS:  Blood pressure (!) 158/147, pulse 98, temperature (!) 90.9 F (32.7 C), resp. rate (!) 23, height 5\' 4"  (1.626 m), weight 90.7 kg, SpO2 100 %.  PHYSICAL EXAMINATION:  Physical Exam  GENERAL:  63 y.o.-year-old patient lying in the bed , ET tube in place.  Critically ill EYES: Pupils equal, round, reactive to light and accommodation. + scleral icterus. Extraocular muscles intact.  HEENT: Head atraumatic, normocephalic. Oropharynx and nasopharynx clear. No oropharyngeal erythema, moist oral mucosa.  NECK:  Supple, no jugular venous distention. No thyroid enlargement, no tenderness.  LUNGS: Normal breath sounds bilaterally, no wheezing, rales, rhonchi. No use of accessory muscles of respiration.  CARDIOVASCULAR: S1, S2 normal. No murmurs, rubs, or gallops.  ABDOMEN: Soft, nontender, nondistended. Bowel sounds present. No organomegaly or mass.  EXTREMITIES: No pedal edema, cyanosis, or clubbing. + 2 pedal & radial pulses b/l.   NEUROLOGIC: Cranial nerves II through XII are intact. No focal Motor  or sensory deficits appreciated b/l PSYCHIATRIC: The patient is alert and oriented x 3. Good affect.  SKIN: Icteric  LABORATORY PANEL:   CBC Recent Labs  Lab 06/18/2019 1321  WBC PENDING  HGB 7.1*  HCT 20.5*  PLT 239   ------------------------------------------------------------------------------------------------------------------  Chemistries  Recent Labs  Lab 06/11/2019 1321  NA 127*  K 6.3*  CL 89*  CO2 11*  GLUCOSE 49*  BUN 131*  CREATININE 8.23*  CALCIUM 9.6  AST 160*  ALT 50*  ALKPHOS 228*  BILITOT 25.9*    ------------------------------------------------------------------------------------------------------------------  Cardiac Enzymes No results for input(s): TROPONINI in the last 168 hours. ------------------------------------------------------------------------------------------------------------------  RADIOLOGY:  Dg Chest Port 1 View  Result Date: 07/07/2019 CLINICAL DATA:  Hypoxia EXAM: PORTABLE CHEST 1 VIEW COMPARISON:  None. FINDINGS: Endotracheal tube tip is 3.4 cm above the carina. Nasogastric tube tip and side port are below the diaphragm. No pneumothorax. There is no evident edema or consolidation. Heart size and pulmonary vascularity are normal. No adenopathy. No bone lesions. IMPRESSION: Tube positions as described without pneumothorax. No edema or consolidation. Cardiac silhouette within normal limits. Electronically Signed   By: Lowella Grip III M.D.   On: 07/06/2019 13:45     IMPRESSION AND PLAN:   *Severe upper GI bleed.  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  GI has seen the patient and Dr. Verl Blalock thought he is too unstable for endoscopy.  Patient is anemic and hypotensive on pressors.  Massive blood transfusion.  4 units packed RBC, platelets, FFP.  Admit to ICU.  Discussed with intensivist Dr. Brayton Layman vitamin K 1 dose Protonix, octreotide drip.  Ceftriaxone ordered. Discussed regarding a Blakemore tube and Dr. Alva Garnet with ICU placed it.  *Cardiac arrest secondary to hypovolemic shock. Presently intubated.  Also on levo fed pressors.  Blood transfusion underway.  Receiving IV fluids.  *Acute kidney injury with hyperkalemia.  Creatinine of 8 and BUN 131.  Likely due to to ATN and hypovolemic shock.  Fluid resuscitation underway.  Monitor input and output.  Discussed with Dr. Zollie Scale of nephrology.  Poor prognosis with multiorgan failure at this time.  All the records are reviewed and case discussed with  ED provider. Management plans discussed with the patient, family and they are in agreement.  CODE STATUS: FULL CODE  TOTAL CRITICAL CARE TIME TAKING CARE OF THIS PATIENT: 80 minutes.   Neita Carp M.D on 06/29/2019 at 2:11 PM  Between 7am to 6pm - Pager - [phone]  After 6pm go to [email or web address] - password [email or web address] [facility]  Office  [phone]  CC: Primary care physician; Guadalupe Maple, MD  Note: This dictation was prepared with Dragon dictation along with smaller phrase technology. Any transcriptional errors that result from this process are unintentional.

## 2019-06-23 NOTE — ED Notes (Addendum)
0.1mg  epi given at 1228 by Gregor Hams, MD [facility] at bedside

## 2019-06-23 NOTE — Consult Note (Signed)
[name], MD [address] [place], [place] [address] Phone: [phone] Fax : [phone]  Consultation  Referring Provider:     Dr. Jacqualine Code Primary Care Physician:  Guadalupe Maple, MD Primary Gastroenterologist: Althia Forts         Reason for Consultation:     GI bleed  Date of Admission:  06/17/2019 Date of Consultation:  06/13/2019         HPI:   Trevor Lopez is a 63 y.o. male who was found to have an upper GI bleed and collapsed at home.  EMS tried to resuscitate the patient and the family states that the patient flatlined twice at home.  The patient was able to be resuscitated and brought to the ER.  Upon arrival to the ER the patient had a copious amount of bleeding from his upper GI tract.  The patient was then given CPR again when he went into cardiac arrest.  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  She also noticed that he had become much more jaundice as of late.  She reports that before his retirement he would very infrequently drink and usually only on the weekends.  The patient received 5 units of packed red blood cells and 2 units of platelets with 1 unit of FFP in the ER.  The patient's INR was prolonged at 2.3 and his hemoglobin was 7.1.  The patient was intubated in the emergency room and put on a ventilator.  The patient has been taking NSAIDs for back pain recently also.  Most of the history was obtained by the family.  Past Medical History:  Diagnosis Date  . BPH without urinary obstruction   . Psoriasis   . Tubular adenoma of colon May 2009   14mm    Past Surgical History:  Procedure Laterality Date  . COLONOSCOPY  May 2009   Dr. Allen Norris; 18mm Tubular adenoma    Prior to Admission medications   Not on File    Family History  Problem Relation Age of Onset  . Alzheimer's disease Father   . Heart disease Maternal  Grandfather   . Heart disease Paternal Grandfather      Social History   Tobacco Use  . Smoking status: Current Every Day Smoker    Packs/day: 0.25    Types: Cigarettes  . Smokeless tobacco: Never Used  Substance Use Topics  . Alcohol use: Yes  . Drug use: No    Allergies as of 06/16/2019  . (No Known Allergies)    Review of Systems:    All systems reviewed and negative except where noted in HPI.   Physical Exam:  Vital signs in last 24 hours: Temp:  [90.9 F (32.7 C)] 90.9 F (32.7 C) (08/14 1346) Pulse Rate:  [81-114] 81 (08/14 1500) Resp:  [5-23] 17 (08/14 1500) BP: (61-158)/(29-147) 98/50 (08/14 1430) SpO2:  [100 %] 100 % (08/14 1600) FiO2 (%):  [40 %-100 %] 40 % (08/14 1600) Weight:  [90.7 kg] 90.7 kg (08/14 1245)   General:   Intubated and paralyzed Head:  Normocephalic and atraumatic. Eyes:   Positive icterus.   Conjunctiva yellow. PERRLA. Ears: Unable to assess. Neck:  Supple; no masses or thyroidomegaly Lungs: Respirations even and unlabored. Lungs clear to auscultation bilaterally.   No wheezes, crackles, or rhonchi.  Heart:  Regular  rate and rhythm;  Without murmur, clicks, rubs or gallops Abdomen: Distended tympanic with inability to assess hepatosplenomegaly Rectal:  Not performed. Msk:  Symmetrical without gross deformities.    Extremities:  Without edema, cyanosis or clubbing. Neurologic: Unable to assess skin:  Intact without significant lesions or rashes. Cervical Nodes:  No significant cervical adenopathy. Psych: Intubated and paralyzed  LAB RESULTS: Recent Labs    06/24/2019 1321  WBC 17.0*  HGB 7.1*  HCT 20.5*  PLT 239   BMET Recent Labs    07/10/2019 1321  NA 127*  K 6.3*  CL 89*  CO2 11*  GLUCOSE 49*  BUN 131*  CREATININE 8.23*  CALCIUM 9.6   LFT Recent Labs    06/29/2019 1321  PROT 4.9*  ALBUMIN 1.6*  AST 160*  ALT 50*  ALKPHOS 228*  BILITOT 25.9*   PT/INR Recent Labs    06/10/2019 1321  LABPROT 24.7*  INR 2.3*     STUDIES: Dg Abdomen 1 View  Result Date: 06/29/2019 CLINICAL DATA:  Check Blakemore catheter placement EXAM: ABDOMEN - 1 VIEW COMPARISON:  None. FINDINGS: Scattered large and small bowel gas is noted. catheter is noted within the mid to distal esophagus. The tip is 8 cm from the stomach. IMPRESSION: Blakemore catheter is noted in the mid to distal esophagus. Advancement is recommended. Electronically Signed   By: Inez Catalina M.D.   On: 06/11/2019 15:45   Dg Chest Port 1 View  Result Date: 06/14/2019 CLINICAL DATA:  Hypoxia EXAM: PORTABLE CHEST 1 VIEW COMPARISON:  None. FINDINGS: Endotracheal tube tip is 3.4 cm above the carina. Nasogastric tube tip and side port are below the diaphragm. No pneumothorax. There is no evident edema or consolidation. Heart size and pulmonary vascularity are normal. No adenopathy. No bone lesions. IMPRESSION: Tube positions as described without pneumothorax. No edema or consolidation. Cardiac silhouette within normal limits. Electronically Signed   By: Lowella Grip III M.D.   On: 07/05/2019 13:45      Impression / Plan:   Assessment: Active Problems:   Upper GI bleed   Trevor Lopez is a 63 y.o. y/o male with an acute GI bleed.  The patient has been taking NSAIDs and Goody powders at home for back pain.  He has also been drinking extensively.  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  The patient has had multiple cardiac arrests.  Plan:  I have spoken to the family including the wife the sister and Mickel Baas and the brother-in-law about the prognosis and the dire condition the patient is in.  I have also explained to them that the patient is in no condition to undergo any endoscopic procedures with his multiple recent arrests.  The patient will be treated conservatively with blood products octreotide and a PPI.  Thank you for involving me in the care of this patient.      LOS: 0 days   [name], MD  07/07/2019, 4:42 PM     Note: This dictation was prepared with Dragon dictation along with smaller phrase technology. Any transcriptional errors that result from this process are unintentional.

## 2019-06-23 NOTE — ED Notes (Signed)
levaphed 4mg  started IV at 4 mcg/min

## 2019-06-23 NOTE — ED Notes (Signed)
Pt arrives via [facility] for GI bleed, fall and unresponsiveness. Pt arrives with lucas device in use. PT skin yellow. PT arrives with IO access in the left leg. Family reports pt has been drinking heavily x 2 weeks and has been confused and hallucinating x 2 days. EMS responded for a fall however pt coded in route to the ambulance.

## 2019-06-23 NOTE — ED Triage Notes (Signed)
Pt arrives via [facility] from home for GI bleed, fall and unresponsiveness. Pt has LUCAS in place when arriving. Pt skin yellow. Family reports pt has been drinking heavily x 2 weeks and has been confused and hallucinating x 2 days. EMS called out for fall but patient coded while in route to ambulance.

## 2019-06-23 NOTE — Procedures (Signed)
Hemodialysis Catheter Insertion Procedure Note Trevor Lopez [id number] [birth year]  Procedure: Insertion of Hemodialysis Catheter Indications: Dialysis Access   Procedure Details Consent: Risks of procedure as well as the alternatives and risks of each were explained to the (patient/caregiver).  Consent for procedure obtained. (Verbal consent obtained from pts wife) Time Out: Verified patient identification, verified procedure, site/side was marked, verified correct patient position, special equipment/implants available, medications/allergies/relevent history reviewed, required imaging and test results available.  Performed  Maximum sterile technique was used including antiseptics, cap, gloves, gown, hand hygiene, mask and sheet. Skin prep: Chlorhexidine; local anesthetic administered Triple lumen hemodialysis catheter was inserted into left internal jugular vein using the Seldinger technique.  Evaluation Blood flow good Complications: No apparent complications Patient did tolerate procedure well. Chest X-ray ordered to verify placement.  CXR: normal.   Left IJ trialysis catheter placed utilizing ultrasound no complications noted during or following procedure.  Marda Stalker, [place] Pager [phone] (please enter 7 digits) PCCM Consult Pager [phone] (please enter 7 digits)

## 2019-06-23 NOTE — ED Notes (Signed)
Per Dr. Jacqualine Code pt is now a DNR

## 2019-06-23 NOTE — ED Notes (Signed)
Blakemore tube inserted by Dr. Alva Garnet

## 2019-06-23 NOTE — ED Notes (Addendum)
Pt intubated at 66 by Dr. Jacqualine Code; OG tube inserted at 1329

## 2019-06-23 NOTE — ED Notes (Signed)
Respiratory at bedside.

## 2019-06-23 NOTE — ED Notes (Signed)
3 bags of fluids in process at this time

## 2019-06-23 NOTE — Progress Notes (Signed)
   06/19/2019 1800  Clinical Encounter Type  Visited With Patient and family together;Health care provider  Visit Type Initial;Spiritual support;Critical Care  Referral From Nurse  Stress Factors  Patient Stress Factors Health changes  Family Stress Factors Health changes;Loss of control;Major life changes  Ch received a call for family support. Wife and Sister-in-law and brother-in-law were in the waiting room. Family received a very poor prognosis of the pt. Family supported each other and ch kept a listening ear. Later when the pt was moved to ICU, pt's daughter, son, and son-in-law came to the hospital as well. Pt later became responsive and that relieved the family of their distress. Yet, family continues to be aware of the pt's critical conditions. Family appreciates ch support and will benefit from continued ch presence. Wife plans to visit the pt in the morning the next day. Ch will notify the next on call ch to provide support.

## 2019-06-23 NOTE — ED Notes (Signed)
Dr. Jacqualine Code informed of gucose 18, verbal to give half an amp of D50

## 2019-06-23 NOTE — Procedures (Addendum)
Central Venous Catheter Insertion Procedure Note DENNARD VEZINA [id number] August 27, 1956  Procedure: Insertion of Central Venous Catheter Indications: Assessment of intravascular volume, Drug and/or fluid administration and Frequent blood sampling  Procedure Details Consent: Risks of procedure as well as the alternatives and risks of each were explained to the (patient/caregiver).  Consent for procedure obtained. (Verbal consent obtained by pts wife) Time Out: Verified patient identification, verified procedure, site/side was marked, verified correct patient position, special equipment/implants available, medications/allergies/relevent history reviewed, required imaging and test results available.  Performed  Maximum sterile technique was used including antiseptics, cap, gloves, gown, hand hygiene, mask and sheet. Skin prep: Chlorhexidine; local anesthetic administered A antimicrobial bonded/coated triple lumen catheter was placed in the right internal jugular vein using the Seldinger technique.  Evaluation Blood flow good Complications: No apparent complications Patient did tolerate procedure well. Chest X-ray ordered to verify placement.  CXR: normal.  Right internal jugular CVL placed utilizing ultrasound no complications noted during or following procedure.  Marda Stalker, [place] Pager [phone] (please enter 7 digits) PCCM Consult Pager [phone] (please enter 7 digits)

## 2019-06-23 NOTE — ED Notes (Signed)
Pads placed at 1231

## 2019-06-23 NOTE — ED Notes (Signed)
ED TO INPATIENT HANDOFF REPORT  ED Nurse Name and Phone #: Janett Billow 89   S Name/Age/Gender Trevor Lopez 63 y.o. male Room/Bed: ED03A/ED03A  Code Status   Code Status: DNR  Home/SNF/Other Home   pt intubated  Is this baseline? No   Triage Complete: Triage complete  Chief Complaint cpr  Triage Note Pt arrives via [facility] from home for GI bleed, fall and unresponsiveness. Pt has LUCAS in place when arriving. Pt skin yellow. Family reports pt has been drinking heavily x 2 weeks and has been confused and hallucinating x 2 days. EMS called out for fall but patient coded while in route to ambulance.    Allergies No Known Allergies  Level of Care/Admitting Diagnosis ED Disposition    ED Disposition Condition [facility] Area: [place] [[id number]]  Level of Care: ICU [6]  Covid Evaluation: Asymptomatic Screening Protocol (No Symptoms)  Diagnosis: Upper GI bleed [387564]  Admitting Physician: Hillary Bow [[id number]]  Attending Physician: Hillary Bow [[id number]]  Estimated length of stay: past midnight tomorrow  Certification:: I certify this patient will need inpatient services for at least 2 midnights  PT Class (Do Not Modify): Inpatient [101]  PT Acc Code (Do Not Modify): Private [1]       B Medical/Surgery History Past Medical History:  Diagnosis Date  . BPH without urinary obstruction   . Psoriasis   . Tubular adenoma of colon May 2009   88mm   Past Surgical History:  Procedure Laterality Date  . COLONOSCOPY  May 2009   Dr. Allen Norris; 23mm Tubular adenoma     A IV Location/Drains/Wounds Patient Lines/Drains/Airways Status   Active Line/Drains/Airways    Name:   Placement date:   Placement time:   Site:   Days:   Peripheral IV 06/16/2019 Right Wrist   07/08/2019    1235    Wrist   less than 1   Peripheral IV 06/27/2019 Left Antecubital   07/01/2019    1239    Antecubital   less than 1   Peripheral IV 06/17/2019 Left Hand   06/22/2019     1259    Hand   less than 1   Peripheral IV 07/02/2019 Right Antecubital   06/29/2019    1437    Antecubital   less than 1   NG/OG Tube Orogastric 16 Fr. Right mouth Documented cm marking at nare/ corner of mouth   06/11/2019    1329    Right mouth   less than 1   Urethral Catheter zach Double-lumen 16 Fr.   07/08/2019    1346    Double-lumen   less than 1   Airway 7.5 mm   07/02/2019    1330     less than 1          Intake/Output Last 24 hours No intake or output data in the 24 hours ending 06/18/2019 1509  Labs/Imaging Results for orders placed or performed during the hospital encounter of 06/12/2019 (from the past 48 hour(s))  Type and screen [facility]     Status: None (Preliminary result)   Collection Time: 06/28/2019 12:38 PM  Result Value Ref Range   ABO/RH(D) O POS    Antibody Screen NEG    Sample Expiration 06/26/2019,2359    Unit Number [id number]    Blood Component Type RED CELLS,LR    Unit division 00    Status of Unit ISSUED    Transfusion Status OK  TO TRANSFUSE    Crossmatch Result COMPATIBLE    Unit Number [id number]    Blood Component Type RED CELLS,LR    Unit division 00    Status of Unit ISSUED    Transfusion Status OK TO TRANSFUSE    Crossmatch Result COMPATIBLE    Unit Number [id number]    Blood Component Type RED CELLS,LR    Unit division 00    Status of Unit ISSUED    Transfusion Status OK TO TRANSFUSE    Crossmatch Result COMPATIBLE    Unit Number [id number]    Blood Component Type RED CELLS,LR    Unit division 00    Status of Unit ISSUED    Transfusion Status OK TO TRANSFUSE    Crossmatch Result COMPATIBLE    Unit Number [id number]    Blood Component Type RED CELLS,LR    Unit division 00    Status of Unit ISSUED    Transfusion Status OK TO TRANSFUSE    Crossmatch Result COMPATIBLE    Unit Number [id number]    Blood Component Type RED CELLS,LR    Unit division 00    Status of Unit ISSUED    Transfusion Status OK TO  TRANSFUSE    Crossmatch Result COMPATIBLE    Unit Number [id number]    Blood Component Type RED CELLS,LR    Unit division 00    Status of Unit ISSUED    Transfusion Status OK TO TRANSFUSE    Crossmatch Result COMPATIBLE    Unit Number [id number]    Blood Component Type RED CELLS,LR    Unit division 00    Status of Unit ISSUED    Transfusion Status OK TO TRANSFUSE    Crossmatch Result      COMPATIBLE Performed at [facility], [address] [place], [place] [address]    Unit Number [id number]    Blood Component Type RED CELLS,LR    Unit division 00    Status of Unit ALLOCATED    Transfusion Status OK TO TRANSFUSE    Crossmatch Result COMPATIBLE    Unit Number [id number]    Blood Component Type RED CELLS,LR    Unit division 00    Status of Unit ALLOCATED    Transfusion Status OK TO TRANSFUSE    Crossmatch Result COMPATIBLE   SARS Coronavirus 2 [facility] order, Performed in [place] hospital lab) Nasopharyngeal Nasopharyngeal Swab     Status: None   Collection Time: 07/08/2019 12:58 PM   Specimen: Nasopharyngeal Swab  Result Value Ref Range   SARS Coronavirus 2 NEGATIVE NEGATIVE    Comment: (NOTE) If result is NEGATIVE SARS-CoV-2 target nucleic acids are NOT DETECTED. The SARS-CoV-2 RNA is generally detectable in upper and lower  respiratory specimens during the acute phase of infection. The lowest  concentration of SARS-CoV-2 viral copies this assay can detect is 250  copies / mL. A negative result does not preclude SARS-CoV-2 infection  and should not be used as the sole basis for treatment or other  patient management decisions.  A negative result may occur with  improper specimen collection / handling, submission of specimen other  than nasopharyngeal swab, presence of viral mutation(s) within the  areas targeted by this assay, and inadequate number of viral copies  (<250 copies / mL). A negative result must be combined with clinical   observations, patient history, and epidemiological information. If result is POSITIVE SARS-CoV-2 target nucleic acids are DETECTED. The SARS-CoV-2 RNA is generally detectable in upper and lower  respiratory specimens dur ing the acute phase of infection.  Positive  results are indicative of active infection with SARS-CoV-2.  Clinical  correlation with patient history and other diagnostic information is  necessary to determine patient infection status.  Positive results do  not rule out bacterial infection or co-infection with other viruses. If result is PRESUMPTIVE POSTIVE SARS-CoV-2 nucleic acids MAY BE PRESENT.   A presumptive positive result was obtained on the submitted specimen  and confirmed on repeat testing.  While 2019 novel coronavirus  (SARS-CoV-2) nucleic acids may be present in the submitted sample  additional confirmatory testing may be necessary for epidemiological  and / or clinical management purposes  to differentiate between  SARS-CoV-2 and other Sarbecovirus currently known to infect humans.  If clinically indicated additional testing with an alternate test  methodology [phone]) is advised. The SARS-CoV-2 RNA is generally  detectable in upper and lower respiratory sp ecimens during the acute  phase of infection. The expected result is Negative. Fact Sheet for Patients:  StrictlyIdeas.no Fact Sheet for Healthcare Providers: BankingDealers.co.za This test is not yet approved or cleared by the [place] FDA and has been authorized for detection and/or diagnosis of SARS-CoV-2 by FDA under an Emergency Use Authorization (EUA).  This EUA will remain in effect (meaning this test can be used) for the duration of the COVID-19 declaration under Section 564(b)(1) of the Act, 21 U.S.C. section 360bbb-3(b)(1), unless the authorization is terminated or revoked sooner. Performed at [facility], [address] [place], [place] [address]   Lactic acid, plasma     Status: None   Collection Time: 06/20/2019  1:21 PM  Result Value Ref Range   Lactic Acid, Venous UNABLE TO REPORT DUE TO ICTERUS 0.5 - 1.9 mmol/L    Comment:  SPOKE TO Edith Endave ON 07/03/2019 AT 1401 ADVISED UNABLE TO GIVE RESULT. MLK Performed at [facility], [address] [place], [place] [address]   Comprehensive metabolic panel     Status: Abnormal   Collection Time: 06/26/2019  1:21 PM  Result Value Ref Range   Sodium 127 (L) 135 - 145 mmol/L    Comment: LYTES REPEATED  MLK   Potassium 6.3 (HH) 3.5 - 5.1 mmol/L    Comment: CRITICAL RESULT CALLED TO, READ BACK BY AND VERIFIED WITH Katalina Magri 06/21/2019  @ 1401  MLK    Chloride 89 (L) 98 - 111 mmol/L   CO2 11 (L) 22 - 32 mmol/L   Glucose, Bld 49 (L) 70 - 99 mg/dL   BUN 131 (H) 8 - 23 mg/dL    Comment: RESULT CONFIRMED BY MANUAL DILUTION   Creatinine, Ser 8.23 (H) 0.61 - 1.24 mg/dL   Calcium 9.6 8.9 - 10.3 mg/dL   Total Protein 4.9 (L) 6.5 - 8.1 g/dL   Albumin 1.6 (L) 3.5 - 5.0 g/dL   AST 160 (H) 15 - 41 U/L   ALT 50 (H) 0 - 44 U/L   Alkaline Phosphatase 228 (H) 38 - 126 U/L   Total Bilirubin 25.9 (HH) 0.3 - 1.2 mg/dL    Comment: CRITICAL RESULT CALLED TO, READ BACK BY AND VERIFIED WITH Delta Deshmukh 06/24/2019 @ 1401  MLK    GFR calc non Af Amer 6 (L) >60 mL/min   GFR calc Af Amer 7 (L) >60 mL/min   Anion gap 27 (H) 5 - 15    Comment: Performed at [facility], [address], [place], [place] [address]  CBC WITH DIFFERENTIAL  Status: Abnormal   Collection Time: 06/20/2019  1:21 PM  Result Value Ref Range   WBC 17.0 (H) 4.0 - 10.5 K/uL   RBC 1.76 (L) 4.22 - 5.81 MIL/uL   Hemoglobin 7.1 (L) 13.0 - 17.0 g/dL   HCT 20.5 (L) 39.0 - 52.0 %   MCV 116.5 (H) 80.0 - 100.0 fL   MCH 40.3 (H) 26.0 - 34.0 pg   MCHC 34.6 30.0 - 36.0 g/dL   RDW 26.7 (H) 11.5 - 15.5 %   Platelets 239 150 - 400 K/uL   Neutrophils Relative % 63 %   Neutro Abs 10.7 (H)  1.7 - 7.7 K/uL   Lymphocytes Relative 20 %   Lymphs Abs 3.4 0.7 - 4.0 K/uL   Monocytes Relative 0 %   Monocytes Absolute 0.0 (L) 0.1 - 1.0 K/uL   Eosinophils Relative 0 %   Eosinophils Absolute 0.0 0.0 - 0.5 K/uL   Basophils Relative 0 %   Basophils Absolute 0.0 0.0 - 0.1 K/uL   WBC Morphology HYPERSEGMENTED NEUT     Comment: MODERATE LEFT SHIFT (>5% METAS AND MYELOS,OCC PRO NOTED) TOXIC GRANULATION    RBC Morphology SCHISTOCYTES NOTED ON SMEAR     Comment: CRITICAL RESULT CALLED TO, READ BACK BY AND VERIFIED WITH: Melonie Germani AT 1444 06/22/2019 KMP    Smear Review PLATELETS APPEAR ADEQUATE    Other 8 %   nRBC 4 (H) 0 /100 WBC   Metamyelocytes Relative 1 %   Myelocytes 8 %   Abs Immature Granulocytes 1.50 (H) 0.00 - 0.07 K/uL   Acanthocytes PRESENT    Schistocytes PRESENT    Tear Drop Cells PRESENT    Target Cells PRESENT    Spherocytes PRESENT     Comment: Performed at [facility], [address] [place], [place] [address]  APTT     Status: Abnormal   Collection Time: 06/24/2019  1:21 PM  Result Value Ref Range   aPTT 63 (H) 24 - 36 seconds    Comment:        IF BASELINE aPTT IS ELEVATED, SUGGEST PATIENT RISK ASSESSMENT BE USED TO DETERMINE APPROPRIATE ANTICOAGULANT THERAPY. Performed at [facility], [address] [place], [place] [address]   Protime-INR     Status: Abnormal   Collection Time: 06/13/2019  1:21 PM  Result Value Ref Range   Prothrombin Time 24.7 (H) 11.4 - 15.2 seconds   INR 2.3 (H) 0.8 - 1.2    Comment: (NOTE) INR goal varies based on device and disease states. Performed at [facility], [address] [place], [place] [address]   Prepare RBC (crossmatch)     Status: None   Collection Time: 07/10/2019  1:30 PM  Result Value Ref Range   Order Confirmation      ORDER PROCESSED BY BLOOD BANK Performed at [facility], [address] [place], [place] [address]   Prepare fresh frozen plasma     Status:  None (Preliminary result)   Collection Time: 06/16/2019  1:30 PM  Result Value Ref Range   Unit Number [id number]    Blood Component Type THAWED PLASMA    Unit division 00    Status of Unit ISSUED    Transfusion Status OK TO TRANSFUSE    Unit Number [id number]    Blood Component Type THAWED PLASMA    Unit division 00    Status of Unit ALLOCATED    Transfusion Status OK TO TRANSFUSE   Prepare platelet pheresis  Status: None (Preliminary result)   Collection Time: 07/02/2019  1:30 PM  Result Value Ref Range   Unit Number [id number]    Blood Component Type PLTPH LI2 PAS    Unit division 00    Status of Unit ALLOCATED    Transfusion Status      OK TO TRANSFUSE Performed at [facility], [address] [place], [place] [address]   Initiate MTP (Blood Bank Notification)     Status: None   Collection Time: 06/17/2019  1:37 PM  Result Value Ref Range   Initiate Massive Transfusion Protocol      MTP ORDER RECEIVED Performed at [facility] [address] [place], [place] [address]   Blood gas, arterial     Status: Abnormal   Collection Time: 06/29/2019  1:55 PM  Result Value Ref Range   FIO2 100.00    Delivery systems VENTILATOR    Mode PRESSURE REGULATED VOLUME CONTROL    VT 500 mL   LHR 30 resp/min   Peep/cpap 5.0 cm H20   pH, Arterial 7.01 (LL) 7.350 - 7.450    Comment: CRITICAL RESULT CALLED TO, READ BACK BY AND VERIFIED WITH: DR SIMONDS 1530 06/15/2019 JGT    pCO2 arterial 44 32.0 - 48.0 mmHg   pO2, Arterial 446 (H) 83.0 - 108.0 mmHg   Bicarbonate 11.1 (L) 20.0 - 28.0 mmol/L   Acid-base deficit 19.7 (H) 0.0 - 2.0 mmol/L   O2 Saturation 99.9 %   Patient temperature 37.0    Collection site RIGHT RADIAL    Sample type ARTERIAL DRAW    Allens test (pass/fail) PASS PASS    Comment: Performed at [facility], [address] [place], [place] [address]  Glucose, capillary     Status: Abnormal   Collection Time: 07/07/2019  2:15 PM  Result  Value Ref Range   Glucose-Capillary 63 (L) 70 - 99 mg/dL   Dg Chest Port 1 View  Result Date: 07/09/2019 CLINICAL DATA:  Hypoxia EXAM: PORTABLE CHEST 1 VIEW COMPARISON:  None. FINDINGS: Endotracheal tube tip is 3.4 cm above the carina. Nasogastric tube tip and side port are below the diaphragm. No pneumothorax. There is no evident edema or consolidation. Heart size and pulmonary vascularity are normal. No adenopathy. No bone lesions. IMPRESSION: Tube positions as described without pneumothorax. No edema or consolidation. Cardiac silhouette within normal limits. Electronically Signed   By: Lowella Grip III M.D.   On: 07/07/2019 13:45    Pending Labs Unresulted Labs (From admission, onward)    Start     Ordered   06/24/19 0500  Comprehensive metabolic panel  Tomorrow morning,   STAT     07/06/2019 1409   06/24/19 0500  CBC  Tomorrow morning,   STAT     06/29/2019 1409   06/22/2019 1426  ABO/Rh  Once,   STAT     06/17/2019 1426   07/07/2019 1409  HIV antibody (Routine Testing)  Add-on,   AD     06/22/2019 1409   06/13/2019 1346  DIC (disseminated intravasc coag) panel (STAT)  (MTP Massive transfusion protocol panel)  Add-on,   AD     06/26/2019 1345   06/28/2019 1321  Lactic acid, plasma  Now then every 2 hours,   STAT     06/16/2019 1321   07/10/2019 1321  Blood Culture (routine x 2)  BLOOD CULTURE X 2,   STAT     07/05/2019 1321   07/08/2019 1321  Urinalysis, Routine w  reflex microscopic  ONCE - STAT,   STAT     07/02/2019 1321   06/12/2019 1321  Urine culture  ONCE - STAT,   STAT     06/29/2019 1321   06/12/2019 1321  Pathologist smear review  Once,   STAT     06/12/2019 1321          Vitals/Pain Today's Vitals   06/19/2019 1415 07/03/2019 1430 06/26/2019 1445 06/27/2019 1500  BP:  (!) 98/50    Pulse: 89 90 88 81  Resp: 17 19 18 17   Temp:      SpO2: 100% 100% 100% 100%  Weight:      Height:        Isolation Precautions No active isolations  Medications Medications  octreotide (SANDOSTATIN) 2 mcg/mL  load via infusion 100 mcg (100 mcg Intravenous Bolus from Bag 07/04/2019 1449)    And  octreotide (SANDOSTATIN) 500 mcg in sodium chloride 0.9 % 250 mL (2 mcg/mL) infusion (50 mcg/hr Intravenous New Bag/Given 06/15/2019 1449)  norepinephrine (LEVOPHED) 4mg  in 240mL premix infusion (13 mcg/min Intravenous Rate/Dose Change 06/21/2019 1419)  sodium chloride flush (NS) 0.9 % injection 3 mL (has no administration in time range)  ondansetron (ZOFRAN) tablet 4 mg (has no administration in time range)    Or  ondansetron (ZOFRAN) injection 4 mg (has no administration in time range)  albuterol (PROVENTIL) (2.5 MG/3ML) 0.083% nebulizer solution 2.5 mg (has no administration in time range)  cefTRIAXone (ROCEPHIN) 1 g in sodium chloride 0.9 % 100 mL IVPB (has no administration in time range)  phytonadione (VITAMIN K) 10 mg in dextrose 5 % 50 mL IVPB (10 mg Intravenous New Bag/Given 06/11/2019 1503)  vasopressin (PITRESSIN) 40 Units in sodium chloride 0.9 % 250 mL (0.16 Units/mL) infusion (0.04 Units/min Intravenous New Bag/Given 07/04/2019 1445)  sodium chloride 0.9 % bolus 1,000 mL (has no administration in time range)  cefTRIAXone (ROCEPHIN) 2 g in sodium chloride 0.9 % 100 mL IVPB (0 g Intravenous Stopped 06/25/2019 1428)  pantoprazole (PROTONIX) injection 40 mg (40 mg Intravenous Given 07/02/2019 1401)  norepinephrine (LEVOPHED) injection (4 mcg/kg/min  90.7 kg Intravenous New Bag/Given 06/25/2019 1349)    Mobility non-ambulatory High fall risk   Focused Assessments 1   R Recommendations: See Admitting Provider Note  Report given to:   Additional Notes:

## 2019-06-23 NOTE — Consult Note (Signed)
CODE SEPSIS - PHARMACY COMMUNICATION  **Broad Spectrum Antibiotics should be administered within 1 hour of Sepsis diagnosis**  Time Code Sepsis Called/Page Received: 1327  Antibiotics Ordered: Ceftriaxone  Time of 1st antibiotic administration: 1355  Additional action taken by pharmacy: None  If necessary, Name of Provider/Nurse Contacted: None  Lu Duffel, PharmD, BCPS Clinical Pharmacist 06/29/2019 1:33 PM

## 2019-06-23 NOTE — ED Notes (Signed)
Patient has positive pulse

## 2019-06-23 NOTE — ED Notes (Signed)
Glucose 148

## 2019-06-23 NOTE — ED Notes (Signed)
10mg  etomidate given at 1325; 10mg  vecoronium given at 1326

## 2019-06-24 ENCOUNTER — Inpatient Hospital Stay: Payer: 59

## 2019-06-24 DIAGNOSIS — R578 Other shock: Secondary | ICD-10-CM

## 2019-06-24 DIAGNOSIS — I469 Cardiac arrest, cause unspecified: Secondary | ICD-10-CM

## 2019-06-24 DIAGNOSIS — Z9911 Dependence on respirator [ventilator] status: Secondary | ICD-10-CM

## 2019-06-24 DIAGNOSIS — K7201 Acute and subacute hepatic failure with coma: Secondary | ICD-10-CM

## 2019-06-24 LAB — TYPE AND SCREEN
ABO/RH(D): O POS
Antibody Screen: NEGATIVE
Unit division: 0
Unit division: 0
Unit division: 0
Unit division: 0
Unit division: 0
Unit division: 0
Unit division: 0
Unit division: 0
Unit division: 0
Unit division: 0
Unit division: 0
Unit division: 0

## 2019-06-24 LAB — CBC
HCT: 36.8 % — ABNORMAL LOW (ref 39.0–52.0)
HCT: 36.3 % — ABNORMAL LOW (ref 39.0–52.0)
Hemoglobin: 13.7 g/dL (ref 13.0–17.0)
Hemoglobin: 13.5 g/dL (ref 13.0–17.0)
MCH: 32.8 pg (ref 26.0–34.0)
MCH: 32.2 pg (ref 26.0–34.0)
MCHC: 37.2 g/dL — ABNORMAL HIGH (ref 30.0–36.0)
MCHC: 37.2 g/dL — ABNORMAL HIGH (ref 30.0–36.0)
MCV: 88 fL (ref 80.0–100.0)
MCV: 86.6 fL (ref 80.0–100.0)
Platelets: 113 10*3/uL — ABNORMAL LOW (ref 150–400)
Platelets: 101 10*3/uL — ABNORMAL LOW (ref 150–400)
RBC: 4.18 MIL/uL — ABNORMAL LOW (ref 4.22–5.81)
RBC: 4.19 MIL/uL — ABNORMAL LOW (ref 4.22–5.81)
RDW: 20.4 % — ABNORMAL HIGH (ref 11.5–15.5)
RDW: 20.2 % — ABNORMAL HIGH (ref 11.5–15.5)
WBC: 8.4 10*3/uL (ref 4.0–10.5)
WBC: 9.3 10*3/uL (ref 4.0–10.5)
nRBC: 1.9 % — ABNORMAL HIGH (ref 0.0–0.2)
nRBC: 3.1 % — ABNORMAL HIGH (ref 0.0–0.2)

## 2019-06-24 LAB — BPAM FFP
Blood Product Expiration Date: 202008192359
Blood Product Expiration Date: 202008192359
Blood Product Expiration Date: 202008192359
Blood Product Expiration Date: 202008192359
ISSUE DATE / TIME: 202008141423
ISSUE DATE / TIME: 202008141519
ISSUE DATE / TIME: 202008141749
ISSUE DATE / TIME: 202008141749
Unit Type and Rh: 2800
Unit Type and Rh: 5100
Unit Type and Rh: 5100
Unit Type and Rh: 9500

## 2019-06-24 LAB — COMPREHENSIVE METABOLIC PANEL
ALT: UNDETERMINED U/L (ref 0–44)
AST: 233 U/L — ABNORMAL HIGH (ref 15–41)
AST: 257 U/L — ABNORMAL HIGH (ref 15–41)
Albumin: 2.2 g/dL — ABNORMAL LOW (ref 3.5–5.0)
Albumin: 2.2 g/dL — ABNORMAL LOW (ref 3.5–5.0)
Alkaline Phosphatase: 163 U/L — ABNORMAL HIGH (ref 38–126)
Alkaline Phosphatase: 164 U/L — ABNORMAL HIGH (ref 38–126)
Anion gap: 18 — ABNORMAL HIGH (ref 5–15)
Anion gap: 17 — ABNORMAL HIGH (ref 5–15)
BUN: 114 mg/dL — ABNORMAL HIGH (ref 8–23)
BUN: 122 mg/dL — ABNORMAL HIGH (ref 8–23)
CO2: 19 mmol/L — ABNORMAL LOW (ref 22–32)
CO2: 22 mmol/L (ref 22–32)
Calcium: 8 mg/dL — ABNORMAL LOW (ref 8.9–10.3)
Calcium: 7.7 mg/dL — ABNORMAL LOW (ref 8.9–10.3)
Chloride: 92 mmol/L — ABNORMAL LOW (ref 98–111)
Chloride: 91 mmol/L — ABNORMAL LOW (ref 98–111)
Creatinine, Ser: UNDETERMINED mg/dL (ref 0.61–1.24)
GFR calc Af Amer: 9 mL/min — ABNORMAL LOW (ref >60)
GFR calc Af Amer: 9 mL/min — ABNORMAL LOW (ref >60)
GFR calc non Af Amer: 8 mL/min — ABNORMAL LOW (ref >60)
GFR calc non Af Amer: 8 mL/min — ABNORMAL LOW (ref >60)
Glucose, Bld: 181 mg/dL — ABNORMAL HIGH (ref 70–99)
Glucose, Bld: 116 mg/dL — ABNORMAL HIGH (ref 70–99)
Potassium: 4.5 mmol/L (ref 3.5–5.1)
Potassium: 4.3 mmol/L (ref 3.5–5.1)
Sodium: 129 mmol/L — ABNORMAL LOW (ref 135–145)
Sodium: 130 mmol/L — ABNORMAL LOW (ref 135–145)
Total Bilirubin: 27.4 mg/dL (ref 0.3–1.2)
Total Bilirubin: 31 mg/dL (ref 0.3–1.2)
Total Protein: 5.5 g/dL — ABNORMAL LOW (ref 6.5–8.1)
Total Protein: 5.4 g/dL — ABNORMAL LOW (ref 6.5–8.1)

## 2019-06-24 LAB — BPAM RBC
Blood Product Expiration Date: 202009052359
Blood Product Expiration Date: 202009052359
Blood Product Expiration Date: 202009072359
Blood Product Expiration Date: 202009072359
Blood Product Expiration Date: 202009072359
Blood Product Expiration Date: 202009072359
Blood Product Expiration Date: 202009072359
Blood Product Expiration Date: 202009072359
Blood Product Expiration Date: 202009072359
Blood Product Expiration Date: 202009072359
Blood Product Expiration Date: 202009072359
Blood Product Expiration Date: 202009142359
ISSUE DATE / TIME: 202008141242
ISSUE DATE / TIME: 202008141242
ISSUE DATE / TIME: 202008141326
ISSUE DATE / TIME: 202008141326
ISSUE DATE / TIME: 202008141345
ISSUE DATE / TIME: 202008141345
ISSUE DATE / TIME: 202008141424
ISSUE DATE / TIME: 202008141424
Unit Type and Rh: 5100
Unit Type and Rh: 5100
Unit Type and Rh: 5100
Unit Type and Rh: 5100
Unit Type and Rh: 5100
Unit Type and Rh: 5100
Unit Type and Rh: 5100
Unit Type and Rh: 5100
Unit Type and Rh: 5100
Unit Type and Rh: 5100
Unit Type and Rh: 5100
Unit Type and Rh: 5100

## 2019-06-24 LAB — URINALYSIS, ROUTINE W REFLEX MICROSCOPIC
RBC / HPF: >50 RBC/hpf — ABNORMAL HIGH (ref 0–5)
Specific Gravity, Urine: 1.02 (ref 1.005–1.030)
Squamous Epithelial / LPF: NONE SEEN (ref 0–5)
WBC, UA: >50 WBC/hpf — ABNORMAL HIGH (ref 0–5)

## 2019-06-24 LAB — RENAL FUNCTION PANEL
Albumin: 2.2 g/dL — ABNORMAL LOW (ref 3.5–5.0)
Albumin: 2.3 g/dL — ABNORMAL LOW (ref 3.5–5.0)
Albumin: 1.7 g/dL — ABNORMAL LOW (ref 3.5–5.0)
Anion gap: 16 — ABNORMAL HIGH (ref 5–15)
Anion gap: 14 (ref 5–15)
Anion gap: 11 (ref 5–15)
BUN: 116 mg/dL — ABNORMAL HIGH (ref 8–23)
BUN: 95 mg/dL — ABNORMAL HIGH (ref 8–23)
BUN: 71 mg/dL — ABNORMAL HIGH (ref 8–23)
CO2: 23 mmol/L (ref 22–32)
CO2: 23 mmol/L (ref 22–32)
CO2: 17 mmol/L — ABNORMAL LOW (ref 22–32)
Calcium: 7.3 mg/dL — ABNORMAL LOW (ref 8.9–10.3)
Calcium: 7.3 mg/dL — ABNORMAL LOW (ref 8.9–10.3)
Calcium: 5.8 mg/dL — CL (ref 8.9–10.3)
Chloride: 93 mmol/L — ABNORMAL LOW (ref 98–111)
Chloride: 96 mmol/L — ABNORMAL LOW (ref 98–111)
Chloride: 106 mmol/L (ref 98–111)
Creatinine, Ser: UNDETERMINED mg/dL (ref 0.61–1.24)
Creatinine, Ser: 4.73 mg/dL — ABNORMAL HIGH (ref 0.61–1.24)
GFR calc Af Amer: 14 mL/min — ABNORMAL LOW (ref >60)
GFR calc non Af Amer: 12 mL/min — ABNORMAL LOW (ref >60)
Glucose, Bld: 92 mg/dL (ref 70–99)
Glucose, Bld: 95 mg/dL (ref 70–99)
Glucose, Bld: 80 mg/dL (ref 70–99)
Phosphorus: 6.6 mg/dL — ABNORMAL HIGH (ref 2.5–4.6)
Phosphorus: 5.9 mg/dL — ABNORMAL HIGH (ref 2.5–4.6)
Potassium: 4.2 mmol/L (ref 3.5–5.1)
Potassium: 4.2 mmol/L (ref 3.5–5.1)
Potassium: 3.3 mmol/L — ABNORMAL LOW (ref 3.5–5.1)
Sodium: 132 mmol/L — ABNORMAL LOW (ref 135–145)
Sodium: 133 mmol/L — ABNORMAL LOW (ref 135–145)
Sodium: 134 mmol/L — ABNORMAL LOW (ref 135–145)

## 2019-06-24 LAB — FIBRIN DERIVATIVES D-DIMER (ARMC ONLY): Fibrin derivatives D-dimer (ARMC): 6940.01 ng/mL (FEU) — ABNORMAL HIGH (ref 0.00–499.00)

## 2019-06-24 LAB — GLUCOSE, CAPILLARY
Glucose-Capillary: 100 mg/dL — ABNORMAL HIGH (ref 70–99)
Glucose-Capillary: 117 mg/dL — ABNORMAL HIGH (ref 70–99)
Glucose-Capillary: 85 mg/dL (ref 70–99)
Glucose-Capillary: 88 mg/dL (ref 70–99)
Glucose-Capillary: 91 mg/dL (ref 70–99)

## 2019-06-24 LAB — BLOOD GAS, ARTERIAL
Acid-base deficit: 0.2 mmol/L (ref 0.0–2.0)
Bicarbonate: 22.6 mmol/L (ref 20.0–28.0)
FIO2: 0.5
MECHVT: 500 mL
Mechanical Rate: 30
O2 Saturation: 96.6 %
PEEP: 5 cmH2O
Patient temperature: 37
pCO2 arterial: 31 mmHg — ABNORMAL LOW (ref 32.0–48.0)
pH, Arterial: 7.47 — ABNORMAL HIGH (ref 7.350–7.450)
pO2, Arterial: 81 mmHg — ABNORMAL LOW (ref 83.0–108.0)

## 2019-06-24 LAB — PREPARE FRESH FROZEN PLASMA
Unit division: 0
Unit division: 0
Unit division: 0
Unit division: 0

## 2019-06-24 LAB — PROTIME-INR
INR: 1.6 — ABNORMAL HIGH (ref 0.8–1.2)
INR: 1.5 — ABNORMAL HIGH (ref 0.8–1.2)
Prothrombin Time: 18.8 seconds — ABNORMAL HIGH (ref 11.4–15.2)
Prothrombin Time: 17.5 seconds — ABNORMAL HIGH (ref 11.4–15.2)

## 2019-06-24 LAB — PREPARE RBC (CROSSMATCH)

## 2019-06-24 LAB — PHOSPHORUS: Phosphorus: 10 mg/dL — ABNORMAL HIGH (ref 2.5–4.6)

## 2019-06-24 LAB — MAGNESIUM
Magnesium: 2.4 mg/dL (ref 1.7–2.4)
Magnesium: 2.1 mg/dL (ref 1.7–2.4)
Magnesium: 2 mg/dL (ref 1.7–2.4)
Magnesium: 1.6 mg/dL — ABNORMAL LOW (ref 1.7–2.4)

## 2019-06-24 LAB — BPAM PLATELET PHERESIS
Blood Product Expiration Date: 202008162359
ISSUE DATE / TIME: 202008141516
Unit Type and Rh: 6200

## 2019-06-24 LAB — PREPARE PLATELET PHERESIS: Unit division: 0

## 2019-06-24 LAB — APTT: aPTT: 36 seconds (ref 24–36)

## 2019-06-24 LAB — FIBRINOGEN: Fibrinogen: 269 mg/dL (ref 210–475)

## 2019-06-24 MED ORDER — FENTANYL CITRATE (PF) 100 MCG/2ML IJ SOLN
50.0000 ug | INTRAMUSCULAR | Status: DC | PRN
  Administered 2019-06-24 – 2019-06-27 (×6): 100 ug via INTRAVENOUS
  Filled 2019-06-24 (×6): qty 2

## 2019-06-24 MED ORDER — INSULIN ASPART 100 UNIT/ML ~~LOC~~ SOLN: 0.0000 [IU] | SUBCUTANEOUS | Status: DC

## 2019-06-24 MED ORDER — HEPARIN SODIUM (PORCINE) 1000 UNIT/ML DIALYSIS
1000.0000 [IU] | INTRAMUSCULAR | Status: DC | PRN
  Administered 2019-06-26 – 2019-06-28 (×2): 2800 [IU] via INTRAVENOUS_CENTRAL
  Filled 2019-06-24 (×2): qty 6
  Filled 2019-06-24 (×2): qty 4
  Filled 2019-06-24: qty 6

## 2019-06-24 MED ORDER — PUREFLOW DIALYSIS SOLUTION
INTRAVENOUS | Status: DC
  Administered 2019-06-24 – 2019-06-25 (×6): via INTRAVENOUS_CENTRAL
  Administered 2019-06-26: 3 via INTRAVENOUS_CENTRAL
  Administered 2019-06-26: 05:00:00 via INTRAVENOUS_CENTRAL
  Administered 2019-06-26 – 2019-06-28 (×7): 3 via INTRAVENOUS_CENTRAL
  Administered 2019-06-28: 19:00:00 via INTRAVENOUS_CENTRAL
  Administered 2019-06-29: 500 via INTRAVENOUS_CENTRAL

## 2019-06-24 MED ORDER — MIDAZOLAM BOLUS VIA INFUSION
1.0000 mg | INTRAVENOUS | Status: DC | PRN
  Administered 2019-06-25 – 2019-06-26 (×3): 2 mg via INTRAVENOUS
  Filled 2019-06-24: qty 2

## 2019-06-24 MED ORDER — PANTOPRAZOLE SODIUM 40 MG IV SOLR
40.0000 mg | Freq: Two times a day (BID) | INTRAVENOUS | Status: DC
  Administered 2019-06-24 – 2019-06-29 (×11): 40 mg via INTRAVENOUS
  Filled 2019-06-24 (×11): qty 40

## 2019-06-24 MED ORDER — HYDROCORTISONE NA SUCCINATE PF 100 MG IJ SOLR
50.0000 mg | Freq: Four times a day (QID) | INTRAMUSCULAR | Status: DC
  Administered 2019-06-24 – 2019-06-25 (×4): 50 mg via INTRAVENOUS
  Filled 2019-06-24 (×4): qty 2

## 2019-06-24 MED ORDER — MIDAZOLAM 50MG/50ML (1MG/ML) PREMIX INFUSION
0.0000 mg/h | INTRAVENOUS | Status: DC
  Administered 2019-06-24 – 2019-06-25 (×3): 2 mg/h via INTRAVENOUS
  Administered 2019-06-26: 5 mg/h via INTRAVENOUS
  Administered 2019-06-26: 8 mg/h via INTRAVENOUS
  Administered 2019-06-26: 23:00:00 7 mg/h via INTRAVENOUS
  Administered 2019-06-26: 8 mg/h via INTRAVENOUS
  Administered 2019-06-27: 18:00:00 5 mg/h via INTRAVENOUS
  Administered 2019-06-27: 7 mg/h via INTRAVENOUS
  Administered 2019-06-28: 5 mg/h via INTRAVENOUS
  Administered 2019-06-28: 4 mg/h via INTRAVENOUS
  Filled 2019-06-24 (×11): qty 50

## 2019-06-24 MED ORDER — VITAMIN K1 10 MG/ML IJ SOLN
10.0000 mg | Freq: Every day | INTRAVENOUS | Status: AC
  Administered 2019-06-24 – 2019-06-26 (×3): 10 mg via INTRAVENOUS
  Filled 2019-06-24 (×4): qty 1

## 2019-06-24 MED FILL — Medication: Qty: 1 | Status: AC

## 2019-06-24 NOTE — Progress Notes (Signed)
CRRT Medication Management:   Pharmacy consulted for CRRT medication adjustments for 63 yo male with respiratory failure, hypovolemic shock secondary to GI Bleed, metabolic acidosis. Patient receiving 4K bath. No medication adjustments warranted at this time.   Pharmacy will continue to monitor and adjust per consult.   MLS 8.15.20

## 2019-06-24 NOTE — Progress Notes (Signed)
CRRT initiated

## 2019-06-24 NOTE — Progress Notes (Signed)
PULMONARY/CCM PROGRESS NOTE  PT PROFILE: 63 y.o. alcoholic with previously undiagnosed cirrhosis admitted via ED with brief out of hospital cardiac arrest after witnessed massive hematemesis.  In ED, profoundly hypotensive and received 6 units RBCs, 4 units FFP, 1 unit platelets on massive transfusion protocol.  He is a heavy drinker and takes large volumes of nonsteroidals.  MAJOR EVENTS/TEST RESULTS: 08/14 admission as documented above 08/14 gastroenterology consultation.  Deemed too unstable for EGD 08/15 RUQ [place]:  08/15 nephrology consultation: CRRT initiated  INDWELLING DEVICES:: R femoral cordis 08/14 >> 08/15 ETT 08/14 >>  R IJ CVL 08/14 >>  L IJ HD cath 08/14 >>   MICRO DATA:  SARS-CoV-2 PCR 8/14 >> NEG  Blood 8/14 >>   ANTIMICROBIALS:  Ceftriaxone 08/14 >>   SUBJ: RASS -4, withdraws from pain.  Not following commands.  Receiving intermittent sedation due to outward appearance of discomfort.  Remains on vasopressin and norepinephrine infusions  OBJ: Vitals:   06/24/19 0930 06/24/19 1000 06/24/19 1030 06/24/19 1100  BP: (!) 99/57 (!) 99/56 99/63 (!) 92/58  Pulse: 94 96 92 89  Resp:  (!) 22 (!) 21 20  Temp: 99.9 F (37.7 C) 99.7 F (37.6 C) 99.5 F (37.5 C) 99.1 F (37.3 C)  TempSrc:      SpO2: 94% 93% 97% 100%  Weight:      Height:       Vent Mode: PRVC FiO2 (%):  [40 %-100 %] 50 % Set Rate:  [18 bmp-30 bmp] 20 bmp Vt Set:  [500 mL] 500 mL PEEP:  [5 cmH20] 5 cmH20 Plateau Pressure:  [11 cmH20-28 cmH20] 12 cmH20   Gen: intubated, RASS -4, synchronous with ventilator, not following commands, severe jaundice HEENT: Severe scleral icterus, NCAT Neck: No LAN, no JVD noted Lungs: Bilateral rhonchi Cardiovascular: Regular, no M Abdomen: Distended, firm, NT, diminished to absent BS Ext: Warm, no edema Neuro: PERRL, EOMI, moves all 4 extremities Skin: Severe jaundice, extensive ecchymoses on BUE   BMP Latest Ref Rng & Units 06/24/2019 07/01/2019 06/24/2019   Glucose 70 - 99 mg/dL 116(H) 181(H) 140(H)  BUN 8 - 23 mg/dL 122(H) 114(H) 113(H)  Creatinine 0.61 - 1.24 mg/dL 6.66(H) 6.65(H) 6.74(H)  BUN/Creat Ratio 9 - 20 - - -  Sodium 135 - 145 mmol/L 130(L) 129(L) 128(L)  Potassium 3.5 - 5.1 mmol/L 4.3 4.5 5.7(H)  Chloride 98 - 111 mmol/L 91(L) 92(L) 90(L)  CO2 22 - 32 mmol/L 22 19(L) 15(L)  Calcium 8.9 - 10.3 mg/dL 7.7(L) 8.0(L) 8.5(L)    Hepatic Function Latest Ref Rng & Units 06/24/2019 07/07/2019 06/26/2019  Total Protein 6.5 - 8.1 g/dL 5.4(L) 5.5(L) 6.1(L)  Albumin 3.5 - 5.0 g/dL 2.2(L) 2.2(L) 2.3(L)  AST 15 - 41 U/L 257(H) 233(H) 258(H)  ALT 0 - 44 U/L 79(H) 73(H) 77(H)  Alk Phosphatase 38 - 126 U/L 164(H) 163(H) 218(H)  Total Bilirubin 0.3 - 1.2 mg/dL 31.0(HH) 27.4(HH) 29.1(HH)    CBC Latest Ref Rng & Units 06/24/2019 06/24/2019 07/06/2019  WBC 4.0 - 10.5 K/uL 9.3 8.4 10.6(H)  Hemoglobin 13.0 - 17.0 g/dL 13.5 13.7 15.1  Hematocrit 39.0 - 52.0 % 36.3(L) 36.8(L) 43.5  Platelets 150 - 400 K/uL 101(L) 113(L) 128(L)      ABG    Component Value Date/Time   PHART 7.47 (H) 06/24/2019 0451   PCO2ART 31 (L) 06/24/2019 0451   PO2ART 81 (L) 06/24/2019 0451   HCO3 22.6 06/24/2019 0451   ACIDBASEDEF 0.2 06/24/2019 0451   O2SAT 96.6 06/24/2019  0451     CXR: Low lung volumes, no definite infiltrates or edema   IMPRESSION: Admitted via ED with massive UGIB, hemorrhagic shock  Ventilator dependent respiratory failure. Intubated due to AMS after brief cardiac arrest S/P brief out of hospital cardiac arrest due to hemorrhagic shock Oliguric AKI Metabolic acidosis Hyperkalemia, resolved Mild hyponatremia UGIB - variceal vs gastric (heavy alcohol abuse, heavy NSAIDs use) Acute liver failure Suspect alcoholic cirrhosis Suspect ascites Acute blood loss anemia - not overtly bleeding at present time Mild thrombocytopenia Coagulopathy due to liver failure Acute encephalopathy, multifactorial High risk of alcohol withdrawal  syndrome ICU/ventilator associated discomfort  PLAN/REC: Cont vent support - settings reviewed and/or adjusted Cont vent bundle Daily SBT if/when meets criteria ICU hemodynamic monitoring Wean norepinephrine to off for MAP >65 mmHg Continue vasopressin And hydrocortisone 8/15 for vasopressor dependent shock Monitor BMET intermittently Monitor I/Os Correct electrolytes as indicated Nephrology consultation requested.  CRRT to be initiated 8/15 RUQ [place] ordered Cont IV pantoprazole Continue octreotide infusion Holding on TF's for now Moderate scale SSI ordered (while on systemic steroids) Monitor temp, WBC count Micro and abx as above DVT px: SCDs Monitor CBC intermittently Transfuse per usual guidelines RASS goal -1,-2 PAD protocol initiated 08/15 - midaz infusion, PRN fentanyl  I have updated the patient's wife in detail.  Guarded prognosis was again conveyed to her.  However, the fact that he has survived to this point can be considered a moderately favorable sign.  We agreed to continue aggressive support to include CRRT.  We agree that ACLS in the event of recurrent cardiac arrest remains medically inappropriate.    CCM time: 60 mins The above time includes time spent in consultation with patient and/or family members and reviewing care plan on multidisciplinary rounds  Merton Border, MD PCCM service Mobile [phone] Pager [phone] 06/24/2019 12:41 PM

## 2019-06-24 NOTE — Progress Notes (Signed)
FIO2 to .60 due to sats dropping to 80's.

## 2019-06-24 NOTE — Progress Notes (Signed)
[facility] [place] NAME: Trevor Lopez    MR#:  [id number]  DATE OF BIRTH:  Sep 12, 1956  SUBJECTIVE:  CHIEF COMPLAINT:   Chief Complaint  Patient presents with  . GI Bleeding   Sedated on vent No further bleeding  REVIEW OF SYSTEMS:    Review of Systems  Unable to perform ROS: Intubated    DRUG ALLERGIES:  No Known Allergies  VITALS:  Blood pressure (!) 98/58, pulse 83, temperature 98.2 F (36.8 C), resp. rate 18, height 5\' 4"  (1.626 m), weight 87.3 kg, SpO2 100 %.  PHYSICAL EXAMINATION:   Physical Exam  GENERAL:  63 y.o.-year-old patient lying in the bed. Sedated. ETT.  Jaundiced EYES: Pupils equal, round, reactive to light and accommodation. +scleral icterus. Extraocular muscles intact.  HEENT: Head atraumatic, normocephalic. Oropharynx and nasopharynx clear.  NECK:  Supple, no jugular venous distention. No thyroid enlargement, no tenderness.  LUNGS: Normal breath sounds bilaterally.  CARDIOVASCULAR: S1, S2 normal. No murmurs, rubs, or gallops.  ABDOMEN: Soft, nontender, nondistended. Bowel sounds present. No organomegaly or mass.  EXTREMITIES: No cyanosis, clubbing or edema b/l.    PSYCHIATRIC: The patient is sedated. SKIN: No obvious rash, lesion, or ulcer.   LABORATORY PANEL:   CBC Recent Labs  Lab 06/24/19 0500  WBC 9.3  HGB 13.5  HCT 36.3*  PLT 101*   ------------------------------------------------------------------------------------------------------------------ Chemistries  Recent Labs  Lab 06/19/2019 2140 06/24/19 0500  NA 129* 130*  K 4.5 4.3  CL 92* 91*  CO2 19* 22  GLUCOSE 181* 116*  BUN 114* 122*  CREATININE 6.65* 6.66*  CALCIUM 8.0* 7.7*  MG 2.4  --   AST 233* 257*  ALT 73* 79*  ALKPHOS 163* 164*  BILITOT 27.4* 31.0*   ------------------------------------------------------------------------------------------------------------------  Cardiac Enzymes No results for input(s): TROPONINI in  the last 168 hours. ------------------------------------------------------------------------------------------------------------------  RADIOLOGY:  Dg Abd 1 View  Result Date: 06/26/2019 CLINICAL DATA:  Orogastric tube placement. EXAM: ABDOMEN - 1 VIEW COMPARISON:  Earlier today. FINDINGS: Interval orogastric tube with its tip in the proximal to mid stomach and side hole in the proximal stomach. Normal bowel gas pattern. Mild thoracolumbar spine degenerative changes. IMPRESSION: Orogastric tube tip in the proximal to mid stomach and side hole in the proximal stomach. Electronically Signed   By: Claudie Revering M.D.   On: 06/10/2019 17:57   Dg Abdomen 1 View  Result Date: 07/01/2019 CLINICAL DATA:  Check Blakemore catheter placement EXAM: ABDOMEN - 1 VIEW COMPARISON:  None. FINDINGS: Scattered large and small bowel gas is noted. catheter is noted within the mid to distal esophagus. The tip is 8 cm from the stomach. IMPRESSION: Blakemore catheter is noted in the mid to distal esophagus. Advancement is recommended. Electronically Signed   By: Inez Catalina M.D.   On: 06/14/2019 15:45   Dg Chest Port 1 View  Result Date: 06/24/2019 CLINICAL DATA:  Respiratory failure. EXAM: PORTABLE CHEST 1 VIEW COMPARISON:  06/13/2019 FINDINGS: There is a right IJ catheter with tip projecting over the SVC. Left IJ catheter is also noted with tip projecting over the SVC. Enteric tube is in place with side port below GE junction. The ET tube tip is above the carina. Normal heart size. Pulmonary vascular congestion is similar to previous exam. IMPRESSION: 1. Pulmonary vascular congestion, similar. 2. Support apparatus positioned as above. Electronically Signed   By: Kerby Moors M.D.   On: 06/24/2019 08:54   Dg Chest [facility]  Result Date: 06/17/2019 CLINICAL DATA:  Central line placement. EXAM: PORTABLE CHEST 1 VIEW COMPARISON:  Chest x-ray from same day at 1:27 p.m. FINDINGS: New left internal jugular central venous  catheter with the tip in the distal SVC. New right internal jugular central venous catheter with the tip in the mid SVC. Unchanged endotracheal tube with the tip 5.2 cm above the carina. New Blakemore tube with the tip in the distal esophagus. The heart size and mediastinal contours are within normal limits. Normal pulmonary vascularity. Low lung volumes with mild bibasilar atelectasis. No focal consolidation, pleural effusion, or pneumothorax. No acute osseous abnormality. IMPRESSION: 1. New bilateral internal jugular central venous catheters without complicating feature. 2. New Blakemore tube with the tip in the distal esophagus. Electronically Signed   By: Titus Dubin M.D.   On: 07/01/2019 17:59   Dg Chest Port 1 View  Result Date: 07/09/2019 CLINICAL DATA:  Hypoxia EXAM: PORTABLE CHEST 1 VIEW COMPARISON:  None. FINDINGS: Endotracheal tube tip is 3.4 cm above the carina. Nasogastric tube tip and side port are below the diaphragm. No pneumothorax. There is no evident edema or consolidation. Heart size and pulmonary vascularity are normal. No adenopathy. No bone lesions. IMPRESSION: Tube positions as described without pneumothorax. No edema or consolidation. Cardiac silhouette within normal limits. Electronically Signed   By: Lowella Grip III M.D.   On: 06/17/2019 13:45   US Abdomen Limited Ruq  Result Date: 06/24/2019 CLINICAL DATA:  Elevated liver function tests. EXAM: ULTRASOUND ABDOMEN LIMITED RIGHT UPPER QUADRANT COMPARISON:  None. FINDINGS: Gallbladder: Mild diffuse wall thickening with a maximum thickness of 5 mm. No visible gallstones or pericholecystic fluid. No sonographic Murphy sign. Common bile duct: Diameter: 5.1 mm, poorly visualized. Liver: Markedly diffusely echogenic. No visible mass. The portal vein cannot be adequately visualized to assess flow. Other: Small amount of free peritoneal fluid. IMPRESSION: 1. Markedly diffusely echogenic liver. This could be due to steatosis,  cirrhosis or chronic hepatitis. 2. Small amount of ascites. 3. Diffuse gallbladder wall thickening. In the absence of visualized gallstones or a sonographic Murphy sign, this may be due to hypoproteinemia, acute hepatitis or chronic acalculous cholecystitis. Electronically Signed   By: Claudie Revering M.D.   On: 06/24/2019 13:34     ASSESSMENT AND PLAN:   * Upper GI bleed likely variceal bleed Bleeding has stopped at this time. 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 Will wait for GI input.  *Cardiac arrest secondary to hypovolemic shock. Presently intubated.  *Acute kidney injury with hyperkalemia.   Hemodialysis to be started  Poor prognosis with multiorgan failure at this time.  All the records are reviewed and case discussed with Care Management/Social Worker Management plans discussed with the patient, family and they are in agreement.  CODE STATUS: DNR  DVT Prophylaxis: SCDs  TOTAL TIME TAKING CARE OF THIS PATIENT: 35 minutes.   Leia Alf Libni Fusaro M.D on 06/24/2019 at 2:34 PM  Between 7am to 6pm - Pager - [phone]  After 6pm go to [email or web address] - password [email or web address] [facility]  Office  [phone]  CC: Primary care physician; Guadalupe Maple, MD  Note: This dictation was prepared with Dragon dictation along with smaller phrase technology. Any transcriptional errors that result from this process are unintentional.

## 2019-06-24 NOTE — Consult Note (Signed)
[facility] CONSULT NOTE    Date: 06/24/2019                  Patient Name:  Trevor Lopez  MRN: [id number]  DOB: 11/18/1955  Age / Sex: 63 y.o., male         PCP: Guadalupe Maple, MD                 Service Requesting Consult: Critical Care                 Reason for Consult: Acute renal failure in setting of cardiac arrest            History of Present Illness: Patient is a 63 y.o. male with a PMHx of alcohol abuse, psoriasis, tubular adenoma of the colon, tobacco abuse who was admitted to [facility] on 06/22/2019 for evaluation of severe GI bleeding.  Patient unable to provide any history at this point time therefore history is obtained through discussions with staff as well as chart review.  Patient has history of heavy alcohol abuse.  Family noticed the patient was vomiting blood and subsequently collapsed.  EMS was subsequently called and patient experienced cardiac arrest while with them.  Patient had CPR for 3 to 4 minutes at that time and had return of spontaneous circulation.  Upon arrival here he was hypotensive and hypothermic.  He was subsequently intubated.  He has received multiple blood products.  Temporary left internal jugular dialysis catheter has been placed.  Urine output thus far has only been 150 cc.  Critical care has to have a discussion with the patient's family and they are now agreeable to renal placement therapy.   Medications: Outpatient medications: No medications prior to admission.    Current medications: Current Facility-Administered Medications  Medication Dose Route Frequency Provider Last Rate Last Dose  . 0.9 %  sodium chloride infusion (Manually program via Guardrails IV Fluids)   Intravenous Once Wilhelmina Mcardle, MD      . albuterol (PROVENTIL) (2.5 MG/3ML) 0.083% nebulizer solution 2.5 mg  2.5 mg Nebulization Q2H PRN Sudini, Alveta Heimlich, MD      . cefTRIAXone (ROCEPHIN) 1 g in sodium chloride 0.9 % 100 mL IVPB  1 g Intravenous Q24H  Sudini, Srikar, MD 200 mL/hr at 06/24/19 1026 1 g at 06/24/19 1026  . chlorhexidine gluconate (MEDLINE KIT) (PERIDEX) 0.12 % solution 15 mL  15 mL Mouth Rinse BID Tukov-Yual, Magdalene S, NP   15 mL at 06/24/19 0807  . Chlorhexidine Gluconate Cloth 2 % PADS 6 each  6 each Topical Q0600 Awilda Bill, NP      . fentaNYL (SUBLIMAZE) injection 50-100 mcg  50-100 mcg Intravenous Q30 min PRN Wilhelmina Mcardle, MD      . heparin injection 1,000-6,000 Units  1,000-6,000 Units CRRT PRN Anyelina Claycomb, MD      . hydrocortisone sodium succinate (SOLU-CORTEF) 100 MG injection 50 mg  50 mg Intravenous Q6H Wilhelmina Mcardle, MD      . insulin aspart (novoLOG) injection 0-15 Units  0-15 Units Subcutaneous Q4H Wilhelmina Mcardle, MD      . MEDLINE mouth rinse  15 mL Mouth Rinse 10 times per day Tukov-Yual, Magdalene S, NP   15 mL at 06/24/19 1028  . midazolam (VERSED) 50 mg/50 mL (1 mg/mL) premix infusion  0-10 mg/hr Intravenous Continuous Wilhelmina Mcardle, MD      . midazolam (VERSED) bolus via infusion 1-2 mg  1-2  mg Intravenous Q2H PRN Wilhelmina Mcardle, MD      . norepinephrine (LEVOPHED) 16 mg in 244m premix infusion  0-40 mcg/min Intravenous Continuous SWilhelmina Mcardle MD 14.06 mL/hr at 06/24/19 1016 15 mcg/min at 06/24/19 1016  . octreotide (SANDOSTATIN) 500 mcg in sodium chloride 0.9 % 250 mL (2 mcg/mL) infusion  50 mcg/hr Intravenous Continuous QDelman Kitten MD 25 mL/hr at 06/24/19 1028 50 mcg/hr at 06/24/19 1028  . ondansetron (ZOFRAN) injection 4 mg  4 mg Intravenous Q6H PRN Sudini, SAlveta Heimlich MD      . pantoprazole (PROTONIX) injection 40 mg  40 mg Intravenous Q12H SWilhelmina Mcardle MD      . phytonadione (VITAMIN K) 10 mg in dextrose 5 % 50 mL IVPB  10 mg Intravenous Daily SWilhelmina Mcardle MD      . pureflow IV solution for Dialysis   CRRT Continuous Knox Holdman, MD      . sodium chloride 0.9 % bolus 1,000 mL  1,000 mL Intravenous Once QDelman Kitten MD      . sodium chloride flush (NS) 0.9 %  injection 3 mL  3 mL Intravenous Q12H Sudini, Srikar, MD   3 mL at 06/24/19 1029  . vasopressin (PITRESSIN) 40 Units in sodium chloride 0.9 % 250 mL (0.16 Units/mL) infusion  0.03 Units/min Intravenous Continuous SWilhelmina Mcardle MD 11.25 mL/hr at 06/24/19 1026 0.03 Units/min at 06/24/19 1026      Allergies: No Known Allergies    Past Medical History: Past Medical History:  Diagnosis Date  . BPH without urinary obstruction   . Psoriasis   . Tubular adenoma of colon May 2009   166m    Past Surgical History: Past Surgical History:  Procedure Laterality Date  . COLONOSCOPY  May 2009   Dr. WoAllen Norris1047mubular adenoma     Family History: Family History  Problem Relation Age of Onset  . Alzheimer's disease Father   . Heart disease Maternal Grandfather   . Heart disease Paternal Grandfather      Social History: Social History   Socioeconomic History  . Marital status: Married    Spouse name: Not on file  . Number of children: Not on file  . Years of education: Not on file  . Highest education level: Not on file  Occupational History  . Not on file  Social Needs  . Financial resource strain: Not on file  . Food insecurity    Worry: Not on file    Inability: Not on file  . Transportation needs    Medical: Not on file    Non-medical: Not on file  Tobacco Use  . Smoking status: Current Every Day Smoker    Packs/day: 0.25    Types: Cigarettes  . Smokeless tobacco: Never Used  Substance and Sexual Activity  . Alcohol use: Yes  . Drug use: No  . Sexual activity: Not on file  Lifestyle  . Physical activity    Days per week: Not on file    Minutes per session: Not on file  . Stress: Not on file  Relationships  . Social conHerbalist phone: Not on file    Gets together: Not on file    Attends religious service: Not on file    Active member of club or organization: Not on file    Attends meetings of clubs or organizations: Not on file     Relationship status: Not on file  . Intimate partner violence  Fear of current or ex partner: Not on file    Emotionally abused: Not on file    Physically abused: Not on file    Forced sexual activity: Not on file  Other Topics Concern  . Not on file  Social History Narrative  . Not on file     Review of Systems: Unable to obtain as the patient is intubated and sedated  Vital Signs: Blood pressure 104/63, pulse 95, temperature 99.9 F (37.7 C), resp. rate (!) 21, height _0  (1.626 m), weight 87.3 kg, SpO2 97 %.  Weight trends: Filed Weights   06/21/2019 1245 06/24/19 0500  Weight: 90.7 kg 87.3 kg    Physical Exam: General: Critically ill-appearing  Head: Normocephalic, atraumatic.  Eyes: Severely jaundiced  Nose: Mucous membranes moist, not inflammed, nonerythematous.  Throat: Endotracheal tube in place  Neck: Supple, trachea midline.  Lungs:  Scattered rhonchi, vent assisted  Heart: S1S2 no rubs  Abdomen:  Distension noted, BS present  Extremities: 2+ LE edema  Neurologic: Intubated, sedated  Skin: Diffuse severe jaundice    Lab results: Basic Metabolic Panel: Recent Labs  Lab 06/25/2019 1715 06/26/2019 2140 06/24/19 0500  NA 128* 129* 130*  K 5.7* 4.5 4.3  CL 90* 92* 91*  CO2 15* 19* 22  GLUCOSE 140* 181* 116*  BUN 113* 114* 122*  CREATININE 6.74* 6.65* 6.66*  CALCIUM 8.5* 8.0* 7.7*  MG  --  2.4  --   PHOS  --  10.0*  --     Liver Function Tests: Recent Labs  Lab 07/01/2019 1715 06/17/2019 2140 06/24/19 0500  AST 258* 233* 257*  ALT 77* 73* 79*  ALKPHOS 218* 163* 164*  BILITOT 29.1* 27.4* 31.0*  PROT 6.1* 5.5* 5.4*  ALBUMIN 2.3* 2.2* 2.2*   No results for input(s): LIPASE, AMYLASE in the last 168 hours. Recent Labs  Lab 06/21/2019 1715  AMMONIA 51*    CBC: Recent Labs  Lab 06/22/2019 1321 06/22/2019 1700 06/24/19 0145 06/24/19 0500  WBC 17.0* 10.6* 8.4 9.3  NEUTROABS 10.7*  --   --   --   HGB 7.1* 15.1 13.7 13.5  HCT 20.5* 43.5 36.8*  36.3*  MCV 116.5* 93.8 88.0 86.6  PLT 239 128* 113* 101*    Cardiac Enzymes: No results for input(s): CKTOTAL, CKMB, CKMBINDEX, TROPONINI in the last 168 hours.  BNP: Invalid input(s): POCBNP  CBG: Recent Labs  Lab 06/15/2019 1415 06/16/2019 1635 07/01/2019 1955 06/21/2019 2233 06/24/19 0600  GLUCAP 63* 143* 178* 173* 117*    Microbiology: Results for orders placed or performed during the hospital encounter of 06/22/2019  Blood Culture (routine x 2)     Status: None (Preliminary result)   Collection Time: 06/16/2019 12:38 PM   Specimen: BLOOD  Result Value Ref Range Status   Specimen Description BLOOD LEFT ANTECUBITAL  Final   Special Requests   Final    BOTTLES DRAWN AEROBIC AND ANAEROBIC Blood Culture adequate volume   Culture   Final    NO GROWTH < 24 HOURS Performed at [facility], [address] [place], [place] [address]    Report Status PENDING  Incomplete  SARS Coronavirus 2 [facility] order, Performed in [facility] hospital lab) Nasopharyngeal Nasopharyngeal Swab     Status: None   Collection Time: 06/14/2019 12:58 PM   Specimen: Nasopharyngeal Swab  Result Value Ref Range Status   SARS Coronavirus 2 NEGATIVE NEGATIVE Final    Comment: (NOTE) If result is NEGATIVE SARS-CoV-2 target nucleic acids  are NOT DETECTED. The SARS-CoV-2 RNA is generally detectable in upper and lower  respiratory specimens during the acute phase of infection. The lowest  concentration of SARS-CoV-2 viral copies this assay can detect is 250  copies / mL. A negative result does not preclude SARS-CoV-2 infection  and should not be used as the sole basis for treatment or other  patient management decisions.  A negative result may occur with  improper specimen collection / handling, submission of specimen other  than nasopharyngeal swab, presence of viral mutation(s) within the  areas targeted by this assay, and inadequate number of viral copies  (<250 copies / mL). A negative result must  be combined with clinical  observations, patient history, and epidemiological information. If result is POSITIVE SARS-CoV-2 target nucleic acids are DETECTED. The SARS-CoV-2 RNA is generally detectable in upper and lower  respiratory specimens dur ing the acute phase of infection.  Positive  results are indicative of active infection with SARS-CoV-2.  Clinical  correlation with patient history and other diagnostic information is  necessary to determine patient infection status.  Positive results do  not rule out bacterial infection or co-infection with other viruses. If result is PRESUMPTIVE POSTIVE SARS-CoV-2 nucleic acids MAY BE PRESENT.   A presumptive positive result was obtained on the submitted specimen  and confirmed on repeat testing.  While 2019 novel coronavirus  (SARS-CoV-2) nucleic acids may be present in the submitted sample  additional confirmatory testing may be necessary for epidemiological  and / or clinical management purposes  to differentiate between  SARS-CoV-2 and other Sarbecovirus currently known to infect humans.  If clinically indicated additional testing with an alternate test  methodology [phone]) is advised. The SARS-CoV-2 RNA is generally  detectable in upper and lower respiratory sp ecimens during the acute  phase of infection. The expected result is Negative. Fact Sheet for Patients:  StrictlyIdeas.no Fact Sheet for Healthcare Providers: BankingDealers.co.za This test is not yet approved or cleared by the [place] FDA and has been authorized for detection and/or diagnosis of SARS-CoV-2 by FDA under an Emergency Use Authorization (EUA).  This EUA will remain in effect (meaning this test can be used) for the duration of the COVID-19 declaration under Section 564(b)(1) of the Act, 21 U.S.C. section 360bbb-3(b)(1), unless the authorization is terminated or revoked sooner. Performed at [facility], [address] [place], [place] [address]   Blood Culture (routine x 2)     Status: None (Preliminary result)   Collection Time: 07/06/2019  6:59 PM   Specimen: BLOOD  Result Value Ref Range Status   Specimen Description BLOOD BLOOD RIGHT HAND  Final   Special Requests   Final    BOTTLES DRAWN AEROBIC AND ANAEROBIC Blood Culture results may not be optimal due to an inadequate volume of blood received in culture bottles   Culture   Final    NO GROWTH < 12 HOURS Performed at [facility], [address], [place],  [address]    Report Status PENDING  Incomplete    Coagulation Studies: Recent Labs    06/19/2019 1720 06/16/2019 2140 06/24/19 0500  LABPROT 19.0* 18.8* 17.5*  INR 1.6* 1.6* 1.5*    Urinalysis: Recent Labs    06/24/19 0500  COLORURINE RED*  LABSPEC 1.020  PHURINE TEST NOT REPORTED DUE TO COLOR INTERFERENCE OF URINE PIGMENT  GLUCOSEU TEST NOT REPORTED DUE TO COLOR INTERFERENCE OF URINE PIGMENT*  HGBUR TEST NOT REPORTED DUE TO COLOR INTERFERENCE OF URINE PIGMENT*  BILIRUBINUR TEST NOT REPORTED DUE TO COLOR INTERFERENCE OF URINE PIGMENT*  KETONESUR TEST NOT REPORTED DUE TO COLOR INTERFERENCE OF URINE PIGMENT*  PROTEINUR TEST NOT REPORTED DUE TO COLOR INTERFERENCE OF URINE PIGMENT*  NITRITE TEST NOT REPORTED DUE TO COLOR INTERFERENCE OF URINE PIGMENT*  LEUKOCYTESUR TEST NOT REPORTED DUE TO COLOR INTERFERENCE OF URINE PIGMENT*      Imaging: Dg Abd 1 View  Result Date: 06/10/2019 CLINICAL DATA:  Orogastric tube placement. EXAM: ABDOMEN - 1 VIEW COMPARISON:  Earlier today. FINDINGS: Interval orogastric tube with its tip in the proximal to mid stomach and side hole in the proximal stomach. Normal bowel gas pattern. Mild thoracolumbar spine degenerative changes. IMPRESSION: Orogastric tube tip in the proximal to mid stomach and side hole in the proximal stomach. Electronically Signed   By: Claudie Revering M.D.   On: 06/20/2019 17:57   Dg Abdomen 1  View  Result Date: 07/06/2019 CLINICAL DATA:  Check Blakemore catheter placement EXAM: ABDOMEN - 1 VIEW COMPARISON:  None. FINDINGS: Scattered large and small bowel gas is noted. catheter is noted within the mid to distal esophagus. The tip is 8 cm from the stomach. IMPRESSION: Blakemore catheter is noted in the mid to distal esophagus. Advancement is recommended. Electronically Signed   By: Inez Catalina M.D.   On: 06/22/2019 15:45   Dg Chest Port 1 View  Result Date: 06/24/2019 CLINICAL DATA:  Respiratory failure. EXAM: PORTABLE CHEST 1 VIEW COMPARISON:  07/03/2019 FINDINGS: There is a right IJ catheter with tip projecting over the SVC. Left IJ catheter is also noted with tip projecting over the SVC. Enteric tube is in place with side port below GE junction. The ET tube tip is above the carina. Normal heart size. Pulmonary vascular congestion is similar to previous exam. IMPRESSION: 1. Pulmonary vascular congestion, similar. 2. Support apparatus positioned as above. Electronically Signed   By: Kerby Moors M.D.   On: 06/24/2019 08:54   Dg Chest Port 1 View  Result Date: 07/04/2019 CLINICAL DATA:  Central line placement. EXAM: PORTABLE CHEST 1 VIEW COMPARISON:  Chest x-ray from same day at 1:27 p.m. FINDINGS: New left internal jugular central venous catheter with the tip in the distal SVC. New right internal jugular central venous catheter with the tip in the mid SVC. Unchanged endotracheal tube with the tip 5.2 cm above the carina. New Blakemore tube with the tip in the distal esophagus. The heart size and mediastinal contours are within normal limits. Normal pulmonary vascularity. Low lung volumes with mild bibasilar atelectasis. No focal consolidation, pleural effusion, or pneumothorax. No acute osseous abnormality. IMPRESSION: 1. New bilateral internal jugular central venous catheters without complicating feature. 2. New Blakemore tube with the tip in the distal esophagus. Electronically Signed   By:  Titus Dubin M.D.   On: 06/26/2019 17:59   Dg Chest Port 1 View  Result Date: 06/24/2019 CLINICAL DATA:  Hypoxia EXAM: PORTABLE CHEST 1 VIEW COMPARISON:  None. FINDINGS: Endotracheal tube tip is 3.4 cm above the carina. Nasogastric tube tip and side port are below the diaphragm. No pneumothorax. There is no evident edema or consolidation. Heart size and pulmonary vascularity are normal. No adenopathy. No bone lesions. IMPRESSION: Tube positions as described without pneumothorax. No edema or consolidation. Cardiac silhouette within normal limits. Electronically Signed   By: Lowella Grip III M.D.   On: 06/26/2019 13:45      Assessment & Plan: Pt is a 63 y.o. male with a PMHx of alcohol abuse, psoriasis,  tubular adenoma of the colon, tobacco abuse who was admitted to [facility] on 06/21/2019 for evaluation of severe GI bleeding.   1.  Severe acute renal failure secondary to hypovolemic shock. 2.  Hypovolemic shock due to GI bleed. 3.  Metabolic acidosis. 4.  Acute respiratory failure. 5.  Recent cardiac arrest. 6.  Alcohol abuse.  Plan: Patient presented with severe hypovolemic shock, cardiac arrest in the setting of acute GI bleed.  Suspect acute GI bleed from esophageal varices.  Creatinine was 8.23 upon admission and currently 6.6.  BUN still remains quite high at 122.  Critical care has discussed the case extensively with the patient's family and they are agreeable to proceeding with renal replacement therapy.  Therefore we will start the patient on CRRT with a blood flow rate of 300 and dialysate flow rate of 2.5 L/h.  We will use a 4K bath as the patient's potassium has corrected.  His metabolic acidosis should also improve with CRRT.  Continue vent support as per pulmonary/critical care.  Overall prognosis guarded however.

## 2019-06-24 NOTE — Progress Notes (Signed)
Cephas Darby, MD [address] [place] [address]  [place], [place] [address]  Main: [phone]  Fax: [phone] Pager: [phone]   Subjective: Patient remains intubated, on 2 different pressors, sedated.  [place] tube was removed as it was stuck in the lower esophageal sphincter.  No active bloody output from the OG tube since yesterday   Objective: Vital signs in last 24 hours: Vitals:   06/24/19 0930 06/24/19 1000 06/24/19 1030 06/24/19 1100  BP: (!) 99/57 (!) 99/56 99/63 (!) 92/58  Pulse: 94 96 92 89  Resp:  (!) 22 (!) 21 20  Temp: 99.9 F (37.7 C) 99.7 F (37.6 C) 99.5 F (37.5 C) 99.1 F (37.3 C)  TempSrc:      SpO2: 94% 93% 97% 100%  Weight:      Height:       Weight change:   Intake/Output Summary (Last 24 hours) at 06/24/2019 1224 Last data filed at 06/24/2019 1100 Gross per 24 hour  Intake 4097.05 ml  Output 450 ml  Net 3647.05 ml     Exam: Heart:: Regular rate and rhythm or S1S2 present Lungs: clear to auscultation Abdomen: Soft, nontender, nondistended   Lab Results: CBC Latest Ref Rng & Units 06/24/2019 06/24/2019 07/07/2019  WBC 4.0 - 10.5 K/uL 9.3 8.4 10.6(H)  Hemoglobin 13.0 - 17.0 g/dL 13.5 13.7 15.1  Hematocrit 39.0 - 52.0 % 36.3(L) 36.8(L) 43.5  Platelets 150 - 400 K/uL 101(L) 113(L) 128(L)   CMP Latest Ref Rng & Units 06/24/2019 07/05/2019 07/04/2019  Glucose 70 - 99 mg/dL 116(H) 181(H) 140(H)  BUN 8 - 23 mg/dL 122(H) 114(H) 113(H)  Creatinine 0.61 - 1.24 mg/dL 6.66(H) 6.65(H) 6.74(H)  Sodium 135 - 145 mmol/L 130(L) 129(L) 128(L)  Potassium 3.5 - 5.1 mmol/L 4.3 4.5 5.7(H)  Chloride 98 - 111 mmol/L 91(L) 92(L) 90(L)  CO2 22 - 32 mmol/L 22 19(L) 15(L)  Calcium 8.9 - 10.3 mg/dL 7.7(L) 8.0(L) 8.5(L)  Total Protein 6.5 - 8.1 g/dL 5.4(L) 5.5(L) 6.1(L)  Total Bilirubin 0.3 - 1.2 mg/dL 31.0(HH) 27.4(HH) 29.1(HH)  Alkaline Phos 38 - 126 U/L 164(H) 163(H) 218(H)  AST 15 - 41 U/L 257(H) 233(H) 258(H)  ALT 0 - 44 U/L 79(H) 73(H) 77(H)     Micro Results: Recent Results (from the past 240 hour(s))  Blood Culture (routine x 2)     Status: None (Preliminary result)   Collection Time: 06/25/2019 12:38 PM   Specimen: BLOOD  Result Value Ref Range Status   Specimen Description BLOOD LEFT ANTECUBITAL  Final   Special Requests   Final    BOTTLES DRAWN AEROBIC AND ANAEROBIC Blood Culture adequate volume   Culture   Final    NO GROWTH < 24 HOURS Performed at [facility], [address], [place], [place]  [address]    Report Status PENDING  Incomplete  SARS Coronavirus 2 [facility] order, Performed in [facility] hospital lab) Nasopharyngeal Nasopharyngeal Swab     Status: None   Collection Time: 07/01/2019 12:58 PM   Specimen: Nasopharyngeal Swab  Result Value Ref Range Status   SARS Coronavirus 2 NEGATIVE NEGATIVE Final    Comment: (NOTE) If result is NEGATIVE SARS-CoV-2 target nucleic acids are NOT DETECTED. The SARS-CoV-2 RNA is generally detectable in upper and lower  respiratory specimens during the acute phase of infection. The lowest  concentration of SARS-CoV-2 viral copies this assay can detect is 250  copies / mL. A negative result does not preclude SARS-CoV-2 infection  and should  not be used as the sole basis for treatment or other  patient management decisions.  A negative result may occur with  improper specimen collection / handling, submission of specimen other  than nasopharyngeal swab, presence of viral mutation(s) within the  areas targeted by this assay, and inadequate number of viral copies  (<250 copies / mL). A negative result must be combined with clinical  observations, patient history, and epidemiological information. If result is POSITIVE SARS-CoV-2 target nucleic acids are DETECTED. The SARS-CoV-2 RNA is generally detectable in upper and lower  respiratory specimens dur ing the acute phase of infection.  Positive  results are indicative of active infection with SARS-CoV-2.  Clinical   correlation with patient history and other diagnostic information is  necessary to determine patient infection status.  Positive results do  not rule out bacterial infection or co-infection with other viruses. If result is PRESUMPTIVE POSTIVE SARS-CoV-2 nucleic acids MAY BE PRESENT.   A presumptive positive result was obtained on the submitted specimen  and confirmed on repeat testing.  While 2019 novel coronavirus  (SARS-CoV-2) nucleic acids may be present in the submitted sample  additional confirmatory testing may be necessary for epidemiological  and / or clinical management purposes  to differentiate between  SARS-CoV-2 and other Sarbecovirus currently known to infect humans.  If clinically indicated additional testing with an alternate test  methodology [phone]) is advised. The SARS-CoV-2 RNA is generally  detectable in upper and lower respiratory sp ecimens during the acute  phase of infection. The expected result is Negative. Fact Sheet for Patients:  StrictlyIdeas.no Fact Sheet for Healthcare Providers: BankingDealers.co.za This test is not yet approved or cleared by the [place] FDA and has been authorized for detection and/or diagnosis of SARS-CoV-2 by FDA under an Emergency Use Authorization (EUA).  This EUA will remain in effect (meaning this test can be used) for the duration of the COVID-19 declaration under Section 564(b)(1) of the Act, 21 U.S.C. section 360bbb-3(b)(1), unless the authorization is terminated or revoked sooner. Performed at [facility], [address] [place], [place] [address]   Blood Culture (routine x 2)     Status: None (Preliminary result)   Collection Time: 07/03/2019  6:59 PM   Specimen: BLOOD  Result Value Ref Range Status   Specimen Description BLOOD BLOOD RIGHT HAND  Final   Special Requests   Final    BOTTLES DRAWN AEROBIC AND ANAEROBIC Blood Culture results may not be optimal  due to an inadequate volume of blood received in culture bottles   Culture   Final    NO GROWTH < 12 HOURS Performed at [facility], [address] [place], [place] [address]    Report Status PENDING  Incomplete   Studies/Results: Dg Abd 1 View  Result Date: 07/09/2019 CLINICAL DATA:  Orogastric tube placement. EXAM: ABDOMEN - 1 VIEW COMPARISON:  Earlier today. FINDINGS: Interval orogastric tube with its tip in the proximal to mid stomach and side hole in the proximal stomach. Normal bowel gas pattern. Mild thoracolumbar spine degenerative changes. IMPRESSION: Orogastric tube tip in the proximal to mid stomach and side hole in the proximal stomach. Electronically Signed   By: Claudie Revering M.D.   On: 06/16/2019 17:57   Dg Abdomen 1 View  Result Date: 06/25/2019 CLINICAL DATA:  Check Blakemore catheter placement EXAM: ABDOMEN - 1 VIEW COMPARISON:  None. FINDINGS: Scattered large and small bowel gas is noted. catheter is noted within the mid to distal esophagus. The  tip is 8 cm from the stomach. IMPRESSION: Blakemore catheter is noted in the mid to distal esophagus. Advancement is recommended. Electronically Signed   By: Inez Catalina M.D.   On: 06/22/2019 15:45   Dg Chest Port 1 View  Result Date: 06/24/2019 CLINICAL DATA:  Respiratory failure. EXAM: PORTABLE CHEST 1 VIEW COMPARISON:  07/07/2019 FINDINGS: There is a right IJ catheter with tip projecting over the SVC. Left IJ catheter is also noted with tip projecting over the SVC. Enteric tube is in place with side port below GE junction. The ET tube tip is above the carina. Normal heart size. Pulmonary vascular congestion is similar to previous exam. IMPRESSION: 1. Pulmonary vascular congestion, similar. 2. Support apparatus positioned as above. Electronically Signed   By: Kerby Moors M.D.   On: 06/24/2019 08:54   Dg Chest Port 1 View  Result Date: 06/22/2019 CLINICAL DATA:  Central line placement. EXAM: PORTABLE CHEST 1 VIEW  COMPARISON:  Chest x-ray from same day at 1:27 p.m. FINDINGS: New left internal jugular central venous catheter with the tip in the distal SVC. New right internal jugular central venous catheter with the tip in the mid SVC. Unchanged endotracheal tube with the tip 5.2 cm above the carina. New Blakemore tube with the tip in the distal esophagus. The heart size and mediastinal contours are within normal limits. Normal pulmonary vascularity. Low lung volumes with mild bibasilar atelectasis. No focal consolidation, pleural effusion, or pneumothorax. No acute osseous abnormality. IMPRESSION: 1. New bilateral internal jugular central venous catheters without complicating feature. 2. New Blakemore tube with the tip in the distal esophagus. Electronically Signed   By: Titus Dubin M.D.   On: 06/19/2019 17:59   Dg Chest Port 1 View  Result Date: 06/13/2019 CLINICAL DATA:  Hypoxia EXAM: PORTABLE CHEST 1 VIEW COMPARISON:  None. FINDINGS: Endotracheal tube tip is 3.4 cm above the carina. Nasogastric tube tip and side port are below the diaphragm. No pneumothorax. There is no evident edema or consolidation. Heart size and pulmonary vascularity are normal. No adenopathy. No bone lesions. IMPRESSION: Tube positions as described without pneumothorax. No edema or consolidation. Cardiac silhouette within normal limits. Electronically Signed   By: Lowella Grip III M.D.   On: 06/22/2019 13:45   Medications:  I have reviewed the patient's current medications. Prior to Admission:  No medications prior to admission.   Scheduled: . sodium chloride   Intravenous Once  . chlorhexidine gluconate (MEDLINE KIT)  15 mL Mouth Rinse BID  . Chlorhexidine Gluconate Cloth  6 each Topical Q0600  . hydrocortisone sod succinate (SOLU-CORTEF) inj  50 mg Intravenous Q6H  . insulin aspart  0-15 Units Subcutaneous Q4H  . mouth rinse  15 mL Mouth Rinse 10 times per day  . pantoprazole  40 mg Intravenous Q12H  . sodium chloride  flush  3 mL Intravenous Q12H   Continuous: . cefTRIAXone (ROCEPHIN)  IV Stopped (06/24/19 1056)  . midazolam 2 mg/hr (06/24/19 1140)  . norepinephrine (LEVOPHED) Adult infusion 15 mcg/min (06/24/19 1100)  . octreotide  (SANDOSTATIN)    IV infusion 50 mcg/hr (06/24/19 1100)  . phytonadione (VITAMIN K) IV 10 mg (06/24/19 1133)  . pureflow    . sodium chloride    . vasopressin (PITRESSIN) infusion - *FOR SHOCK* 0.03 Units/min (06/24/19 1100)   DQQ:[id number], fentaNYL (SUBLIMAZE) injection, heparin, midazolam, [DISCONTINUED] ondansetron **OR** ondansetron (ZOFRAN) IV Anti-infectives (From admission, onward)   Start     Dose/Rate Route Frequency Ordered Stop   06/24/19 1000  cefTRIAXone (ROCEPHIN) 1 g in sodium chloride 0.9 % 100 mL IVPB     1 g 200 mL/hr over 30 Minutes Intravenous Every 24 hours 06/12/2019 1410     07/10/2019 1330  cefTRIAXone (ROCEPHIN) 2 g in sodium chloride 0.9 % 100 mL IVPB     2 g 200 mL/hr over 30 Minutes Intravenous  Once 06/27/2019 1321 06/20/2019 1428     Scheduled Meds: . sodium chloride   Intravenous Once  . chlorhexidine gluconate (MEDLINE KIT)  15 mL Mouth Rinse BID  . Chlorhexidine Gluconate Cloth  6 each Topical Q0600  . hydrocortisone sod succinate (SOLU-CORTEF) inj  50 mg Intravenous Q6H  . insulin aspart  0-15 Units Subcutaneous Q4H  . mouth rinse  15 mL Mouth Rinse 10 times per day  . pantoprazole  40 mg Intravenous Q12H  . sodium chloride flush  3 mL Intravenous Q12H   Continuous Infusions: . cefTRIAXone (ROCEPHIN)  IV Stopped (06/24/19 1056)  . midazolam 2 mg/hr (06/24/19 1140)  . norepinephrine (LEVOPHED) Adult infusion 15 mcg/min (06/24/19 1100)  . octreotide  (SANDOSTATIN)    IV infusion 50 mcg/hr (06/24/19 1100)  . phytonadione (VITAMIN K) IV 10 mg (06/24/19 1133)  . pureflow    . sodium chloride    . vasopressin (PITRESSIN) infusion - *FOR SHOCK* 0.03 Units/min (06/24/19 1100)   PRN Meds:.albuterol, fentaNYL (SUBLIMAZE) injection, heparin,  midazolam, [DISCONTINUED] ondansetron **OR** ondansetron (ZOFRAN) IV   Assessment: Active Problems:   Upper GI bleed   Acute GI bleeding  Hemorrhagic shock, cardiac arrest, multiorgan failure No active bleeding at this time  Plan: Do not recommend any endoscopic intervention at this time as patient is critically ill Continue octreotide drip, IV Protonix Overall prognosis guarded GI will sign off, please call us back with questions or concerns   LOS: 1 day   Rohini Vanga 06/24/2019, 12:24 PM

## 2019-06-25 ENCOUNTER — Inpatient Hospital Stay: Payer: 59

## 2019-06-25 DIAGNOSIS — J9601 Acute respiratory failure with hypoxia: Secondary | ICD-10-CM

## 2019-06-25 DIAGNOSIS — K7031 Alcoholic cirrhosis of liver with ascites: Secondary | ICD-10-CM

## 2019-06-25 DIAGNOSIS — K922 Gastrointestinal hemorrhage, unspecified: Secondary | ICD-10-CM

## 2019-06-25 DIAGNOSIS — R579 Shock, unspecified: Secondary | ICD-10-CM

## 2019-06-25 DIAGNOSIS — N289 Disorder of kidney and ureter, unspecified: Secondary | ICD-10-CM

## 2019-06-25 LAB — RENAL FUNCTION PANEL
Albumin: 2.1 g/dL — ABNORMAL LOW (ref 3.5–5.0)
Albumin: 2.2 g/dL — ABNORMAL LOW (ref 3.5–5.0)
Albumin: 2.2 g/dL — ABNORMAL LOW (ref 3.5–5.0)
Anion gap: 11 (ref 5–15)
Anion gap: 13 (ref 5–15)
Anion gap: 18 — ABNORMAL HIGH (ref 5–15)
BUN: 73 mg/dL — ABNORMAL HIGH (ref 8–23)
BUN: 54 mg/dL — ABNORMAL HIGH (ref 8–23)
BUN: 50 mg/dL — ABNORMAL HIGH (ref 8–23)
CO2: 23 mmol/L (ref 22–32)
CO2: 24 mmol/L (ref 22–32)
CO2: 23 mmol/L (ref 22–32)
Calcium: 7.4 mg/dL — ABNORMAL LOW (ref 8.9–10.3)
Calcium: 7.5 mg/dL — ABNORMAL LOW (ref 8.9–10.3)
Calcium: 7.5 mg/dL — ABNORMAL LOW (ref 8.9–10.3)
Chloride: 98 mmol/L (ref 98–111)
Chloride: 95 mmol/L — ABNORMAL LOW (ref 98–111)
Chloride: 91 mmol/L — ABNORMAL LOW (ref 98–111)
Creatinine, Ser: 3.35 mg/dL — ABNORMAL HIGH (ref 0.61–1.24)
Creatinine, Ser: 2.24 mg/dL — ABNORMAL HIGH (ref 0.61–1.24)
Creatinine, Ser: 1.96 mg/dL — ABNORMAL HIGH (ref 0.61–1.24)
GFR calc Af Amer: 22 mL/min — ABNORMAL LOW (ref >60)
GFR calc Af Amer: 35 mL/min — ABNORMAL LOW (ref >60)
GFR calc Af Amer: 41 mL/min — ABNORMAL LOW (ref >60)
GFR calc non Af Amer: 19 mL/min — ABNORMAL LOW (ref >60)
GFR calc non Af Amer: 30 mL/min — ABNORMAL LOW (ref >60)
GFR calc non Af Amer: 36 mL/min — ABNORMAL LOW (ref >60)
Glucose, Bld: 97 mg/dL (ref 70–99)
Glucose, Bld: 108 mg/dL — ABNORMAL HIGH (ref 70–99)
Glucose, Bld: 105 mg/dL — ABNORMAL HIGH (ref 70–99)
Phosphorus: 4.9 mg/dL — ABNORMAL HIGH (ref 2.5–4.6)
Phosphorus: 4.2 mg/dL (ref 2.5–4.6)
Phosphorus: 4 mg/dL (ref 2.5–4.6)
Potassium: 4.4 mmol/L (ref 3.5–5.1)
Potassium: 4.4 mmol/L (ref 3.5–5.1)
Potassium: 4.4 mmol/L (ref 3.5–5.1)
Sodium: 132 mmol/L — ABNORMAL LOW (ref 135–145)
Sodium: 132 mmol/L — ABNORMAL LOW (ref 135–145)
Sodium: 132 mmol/L — ABNORMAL LOW (ref 135–145)

## 2019-06-25 LAB — BASIC METABOLIC PANEL
Anion gap: 13 (ref 5–15)
BUN: 66 mg/dL — ABNORMAL HIGH (ref 8–23)
CO2: 22 mmol/L (ref 22–32)
Calcium: 7.6 mg/dL — ABNORMAL LOW (ref 8.9–10.3)
Chloride: 98 mmol/L (ref 98–111)
Creatinine, Ser: UNDETERMINED mg/dL (ref 0.61–1.24)
Glucose, Bld: 99 mg/dL (ref 70–99)
Potassium: 4.4 mmol/L (ref 3.5–5.1)
Sodium: 133 mmol/L — ABNORMAL LOW (ref 135–145)

## 2019-06-25 LAB — MAGNESIUM
Magnesium: 2 mg/dL (ref 1.7–2.4)
Magnesium: 2.1 mg/dL (ref 1.7–2.4)
Magnesium: 1.9 mg/dL (ref 1.7–2.4)
Magnesium: 1.9 mg/dL (ref 1.7–2.4)

## 2019-06-25 LAB — CBC
HCT: 38.7 % — ABNORMAL LOW (ref 39.0–52.0)
Hemoglobin: 14.1 g/dL (ref 13.0–17.0)
MCH: 33.2 pg (ref 26.0–34.0)
MCHC: 36.4 g/dL — ABNORMAL HIGH (ref 30.0–36.0)
MCV: 91.1 fL (ref 80.0–100.0)
Platelets: 96 10*3/uL — ABNORMAL LOW (ref 150–400)
RBC: 4.25 MIL/uL (ref 4.22–5.81)
RDW: 22.1 % — ABNORMAL HIGH (ref 11.5–15.5)
WBC: 11.9 10*3/uL — ABNORMAL HIGH (ref 4.0–10.5)
nRBC: 2 % — ABNORMAL HIGH (ref 0.0–0.2)

## 2019-06-25 LAB — GLUCOSE, CAPILLARY
Glucose-Capillary: 95 mg/dL (ref 70–99)
Glucose-Capillary: 108 mg/dL — ABNORMAL HIGH (ref 70–99)
Glucose-Capillary: 98 mg/dL (ref 70–99)
Glucose-Capillary: 99 mg/dL (ref 70–99)
Glucose-Capillary: 98 mg/dL (ref 70–99)

## 2019-06-25 LAB — HEPATIC FUNCTION PANEL
ALT: UNDETERMINED U/L (ref 0–44)
AST: 317 U/L — ABNORMAL HIGH (ref 15–41)
Albumin: 2.1 g/dL — ABNORMAL LOW (ref 3.5–5.0)
Alkaline Phosphatase: 175 U/L — ABNORMAL HIGH (ref 38–126)
Bilirubin, Direct: 17.1 mg/dL — ABNORMAL HIGH (ref 0.0–0.2)
Indirect Bilirubin: 14 mg/dL — ABNORMAL HIGH (ref 0.3–0.9)
Total Bilirubin: 31.1 mg/dL (ref 0.3–1.2)
Total Protein: 5.7 g/dL — ABNORMAL LOW (ref 6.5–8.1)

## 2019-06-25 LAB — TROPONIN I (HIGH SENSITIVITY): Troponin I (High Sensitivity): 62 ng/L — ABNORMAL HIGH (ref <18)

## 2019-06-25 LAB — PROTIME-INR
INR: 1.5 — ABNORMAL HIGH (ref 0.8–1.2)
Prothrombin Time: 17.8 seconds — ABNORMAL HIGH (ref 11.4–15.2)

## 2019-06-25 LAB — HIV ANTIBODY (ROUTINE TESTING W REFLEX): HIV Screen 4th Generation wRfx: NONREACTIVE

## 2019-06-25 MED ORDER — RIFAXIMIN 550 MG PO TABS
550.0000 mg | ORAL_TABLET | Freq: Two times a day (BID) | ORAL | Status: DC
  Administered 2019-06-25 – 2019-06-26 (×3): 550 mg via ORAL
  Filled 2019-06-25 (×3): qty 1

## 2019-06-25 MED ORDER — LACTULOSE 10 GM/15ML PO SOLN
20.0000 g | Freq: Every day | ORAL | Status: DC
  Administered 2019-06-25 – 2019-06-26 (×2): 20 g via ORAL
  Filled 2019-06-25 (×2): qty 30

## 2019-06-25 MED ORDER — HYDROCORTISONE NA SUCCINATE PF 100 MG IJ SOLR
50.0000 mg | Freq: Three times a day (TID) | INTRAMUSCULAR | Status: DC
  Administered 2019-06-25 – 2019-06-28 (×9): 50 mg via INTRAVENOUS
  Filled 2019-06-25 (×9): qty 2

## 2019-06-25 NOTE — Progress Notes (Addendum)
Cephas Darby, MD [address]  [place]  [place], [place] [address]  Main: [phone]  Fax: [phone] Pager: [phone]   Subjective: Patient remains intubated, currently on levophed, started on CRRT.  Patient's wife and his daughter at bedside.  No bleeding episodes overnight.  He did not have any bowel movement.  Octreotide drip was discontinued by the ICU team  Objective: Vital signs in last 24 hours: Vitals:   06/25/19 1157 06/25/19 1200 06/25/19 1300 06/25/19 1400  BP:  (!) 87/52 (!) 90/53 (!) 92/57  Pulse:  83 85 86  Resp:  '15 14 12  '$ Temp:  97.9 F (36.6 C) 98.2 F (36.8 C) 98.2 F (36.8 C)  TempSrc:  (P) Bladder    SpO2: 92% 93% 93% 92%  Weight:      Height:       Weight change: -0.619 kg  Intake/Output Summary (Last 24 hours) at 06/25/2019 1430 Last data filed at 06/25/2019 0800 Gross per 24 hour  Intake 974.31 ml  Output 200 ml  Net 774.31 ml     Exam: Heart:: Regular rate and rhythm or S1S2 present Lungs: clear to auscultation Abdomen: Soft, nontender, nondistended   Lab Results: CBC Latest Ref Rng & Units 06/25/2019 06/24/2019 06/24/2019  WBC 4.0 - 10.5 K/uL 11.9(H) 9.3 8.4  Hemoglobin 13.0 - 17.0 g/dL 14.1 13.5 13.7  Hematocrit 39.0 - 52.0 % 38.7(L) 36.3(L) 36.8(L)  Platelets 150 - 400 K/uL 96(L) 101(L) 113(L)   CMP Latest Ref Rng & Units 06/25/2019 06/25/2019 06/25/2019  Glucose 70 - 99 mg/dL 108(H) 99 97  BUN 8 - 23 mg/dL 54(H) 66(H) 73(H)  Creatinine 0.61 - 1.24 mg/dL 2.24(H) UNABLE TO REPORT DUE TO ICTERUS 3.35(H)  Sodium 135 - 145 mmol/L 132(L) 133(L) 132(L)  Potassium 3.5 - 5.1 mmol/L 4.4 4.4 4.4  Chloride 98 - 111 mmol/L 95(L) 98 98  CO2 22 - 32 mmol/L '24 22 23  '$ Calcium 8.9 - 10.3 mg/dL 7.5(L) 7.6(L) 7.4(L)  Total Protein 6.5 - 8.1 g/dL - 5.7(L) -  Total Bilirubin 0.3 - 1.2 mg/dL - 31.1(HH) -  Alkaline Phos 38 - 126 U/L - 175(H) -  AST 15 - 41 U/L - 317(H) -  ALT 0 - 44 U/L - UNABLE TO REPORT DUE TO ICTERUS -    Micro  Results: Recent Results (from the past 240 hour(s))  Blood Culture (routine x 2)     Status: None (Preliminary result)   Collection Time: 06/10/2019 12:38 PM   Specimen: BLOOD  Result Value Ref Range Status   Specimen Description BLOOD LEFT ANTECUBITAL  Final   Special Requests   Final    BOTTLES DRAWN AEROBIC AND ANAEROBIC Blood Culture adequate volume   Culture   Final    NO GROWTH 2 DAYS Performed at [facility], [address], [place], [place] [address]    Report Status PENDING  Incomplete  SARS Coronavirus 2 [facility] order, Performed in [place] hospital lab) Nasopharyngeal Nasopharyngeal Swab     Status: None   Collection Time: 06/14/2019 12:58 PM   Specimen: Nasopharyngeal Swab  Result Value Ref Range Status   SARS Coronavirus 2 NEGATIVE NEGATIVE Final    Comment: (NOTE) If result is NEGATIVE SARS-CoV-2 target nucleic acids are NOT DETECTED. The SARS-CoV-2 RNA is generally detectable in upper and lower  respiratory specimens during the acute phase of infection. The lowest  concentration of SARS-CoV-2 viral copies this assay can detect is 250  copies / mL.  A negative result does not preclude SARS-CoV-2 infection  and should not be used as the sole basis for treatment or other  patient management decisions.  A negative result may occur with  improper specimen collection / handling, submission of specimen other  than nasopharyngeal swab, presence of viral mutation(s) within the  areas targeted by this assay, and inadequate number of viral copies  (<250 copies / mL). A negative result must be combined with clinical  observations, patient history, and epidemiological information. If result is POSITIVE SARS-CoV-2 target nucleic acids are DETECTED. The SARS-CoV-2 RNA is generally detectable in upper and lower  respiratory specimens dur ing the acute phase of infection.  Positive  results are indicative of active infection with SARS-CoV-2.  Clinical  correlation with  patient history and other diagnostic information is  necessary to determine patient infection status.  Positive results do  not rule out bacterial infection or co-infection with other viruses. If result is PRESUMPTIVE POSTIVE SARS-CoV-2 nucleic acids MAY BE PRESENT.   A presumptive positive result was obtained on the submitted specimen  and confirmed on repeat testing.  While 2019 novel coronavirus  (SARS-CoV-2) nucleic acids may be present in the submitted sample  additional confirmatory testing may be necessary for epidemiological  and / or clinical management purposes  to differentiate between  SARS-CoV-2 and other Sarbecovirus currently known to infect humans.  If clinically indicated additional testing with an alternate test  methodology [phone]) is advised. The SARS-CoV-2 RNA is generally  detectable in upper and lower respiratory sp ecimens during the acute  phase of infection. The expected result is Negative. Fact Sheet for Patients:  StrictlyIdeas.no Fact Sheet for Healthcare Providers: BankingDealers.co.za This test is not yet approved or cleared by the [place] FDA and has been authorized for detection and/or diagnosis of SARS-CoV-2 by FDA under an Emergency Use Authorization (EUA).  This EUA will remain in effect (meaning this test can be used) for the duration of the COVID-19 declaration under Section 564(b)(1) of the Act, 21 U.S.C. section 360bbb-3(b)(1), unless the authorization is terminated or revoked sooner. Performed at [facility], [address] [place], [place] [address]   Blood Culture (routine x 2)     Status: None (Preliminary result)   Collection Time: 06/16/2019  6:59 PM   Specimen: BLOOD  Result Value Ref Range Status   Specimen Description BLOOD BLOOD RIGHT HAND  Final   Special Requests   Final    BOTTLES DRAWN AEROBIC AND ANAEROBIC Blood Culture results may not be optimal due to an  inadequate volume of blood received in culture bottles   Culture   Final    NO GROWTH 2 DAYS Performed at [facility], [address] [place], [place] [address]    Report Status PENDING  Incomplete   Studies/Results: Dg Abd 1 View  Result Date: 06/14/2019 CLINICAL DATA:  Orogastric tube placement. EXAM: ABDOMEN - 1 VIEW COMPARISON:  Earlier today. FINDINGS: Interval orogastric tube with its tip in the proximal to mid stomach and side hole in the proximal stomach. Normal bowel gas pattern. Mild thoracolumbar spine degenerative changes. IMPRESSION: Orogastric tube tip in the proximal to mid stomach and side hole in the proximal stomach. Electronically Signed   By: Claudie Revering M.D.   On: 07/09/2019 17:57   Dg Abdomen 1 View  Result Date: 07/10/2019 CLINICAL DATA:  Check Blakemore catheter placement EXAM: ABDOMEN - 1 VIEW COMPARISON:  None. FINDINGS: Scattered large and small bowel gas is noted.  catheter is noted within the mid to distal esophagus. The tip is 8 cm from the stomach. IMPRESSION: Blakemore catheter is noted in the mid to distal esophagus. Advancement is recommended. Electronically Signed   By: Inez Catalina M.D.   On: 06/14/2019 15:45   Dg Chest Port 1 View  Result Date: 06/25/2019 CLINICAL DATA:  Elevated liver function tests EXAM: PORTABLE CHEST 1 VIEW COMPARISON:  Yesterday FINDINGS: Endotracheal tube tip at the clavicular heads. Bilateral IJ line with tips at the SVC. The orogastric tube reaches the stomach at least. Low volume chest with interstitial crowding/atelectasis. Stable heart size. No pneumothorax. IMPRESSION: Stable hardware positioning and low volume chest with basilar opacification. Electronically Signed   By: Monte Fantasia M.D.   On: 06/25/2019 07:44   Dg Chest Port 1 View  Result Date: 06/24/2019 CLINICAL DATA:  Respiratory failure. EXAM: PORTABLE CHEST 1 VIEW COMPARISON:  06/22/2019 FINDINGS: There is a right IJ catheter with tip projecting over the  SVC. Left IJ catheter is also noted with tip projecting over the SVC. Enteric tube is in place with side port below GE junction. The ET tube tip is above the carina. Normal heart size. Pulmonary vascular congestion is similar to previous exam. IMPRESSION: 1. Pulmonary vascular congestion, similar. 2. Support apparatus positioned as above. Electronically Signed   By: Kerby Moors M.D.   On: 06/24/2019 08:54   Dg Chest Port 1 View  Result Date: 07/03/2019 CLINICAL DATA:  Central line placement. EXAM: PORTABLE CHEST 1 VIEW COMPARISON:  Chest x-ray from same day at 1:27 p.m. FINDINGS: New left internal jugular central venous catheter with the tip in the distal SVC. New right internal jugular central venous catheter with the tip in the mid SVC. Unchanged endotracheal tube with the tip 5.2 cm above the carina. New Blakemore tube with the tip in the distal esophagus. The heart size and mediastinal contours are within normal limits. Normal pulmonary vascularity. Low lung volumes with mild bibasilar atelectasis. No focal consolidation, pleural effusion, or pneumothorax. No acute osseous abnormality. IMPRESSION: 1. New bilateral internal jugular central venous catheters without complicating feature. 2. New Blakemore tube with the tip in the distal esophagus. Electronically Signed   By: Titus Dubin M.D.   On: 06/24/2019 17:59   US Abdomen Limited Ruq  Result Date: 06/24/2019 CLINICAL DATA:  Elevated liver function tests. EXAM: ULTRASOUND ABDOMEN LIMITED RIGHT UPPER QUADRANT COMPARISON:  None. FINDINGS: Gallbladder: Mild diffuse wall thickening with a maximum thickness of 5 mm. No visible gallstones or pericholecystic fluid. No sonographic Murphy sign. Common bile duct: Diameter: 5.1 mm, poorly visualized. Liver: Markedly diffusely echogenic. No visible mass. The portal vein cannot be adequately visualized to assess flow. Other: Small amount of free peritoneal fluid. IMPRESSION: 1. Markedly diffusely echogenic  liver. This could be due to steatosis, cirrhosis or chronic hepatitis. 2. Small amount of ascites. 3. Diffuse gallbladder wall thickening. In the absence of visualized gallstones or a sonographic Murphy sign, this may be due to hypoproteinemia, acute hepatitis or chronic acalculous cholecystitis. Electronically Signed   By: Claudie Revering M.D.   On: 06/24/2019 13:34   Medications:  I have reviewed the patient's current medications. Prior to Admission:  No medications prior to admission.   Scheduled:  sodium chloride   Intravenous Once   chlorhexidine gluconate (MEDLINE KIT)  15 mL Mouth Rinse BID   Chlorhexidine Gluconate Cloth  6 each Topical Q0600   hydrocortisone sod succinate (SOLU-CORTEF) inj  50 mg Intravenous Q8H   insulin  aspart  0-15 Units Subcutaneous Q4H   lactulose  20 g Oral Daily   mouth rinse  15 mL Mouth Rinse 10 times per day   pantoprazole  40 mg Intravenous Q12H   rifaximin  550 mg Oral BID   sodium chloride flush  3 mL Intravenous Q12H   Continuous:  cefTRIAXone (ROCEPHIN)  IV 1 g (06/25/19 1046)   midazolam 2 mg/hr (06/25/19 0600)   norepinephrine (LEVOPHED) Adult infusion 15 mcg/min (06/25/19 1155)   phytonadione (VITAMIN K) IV 10 mg (06/25/19 1053)   pureflow 2,500 mL/hr at 06/25/19 1223   [id number], fentaNYL (SUBLIMAZE) injection, heparin, midazolam, [DISCONTINUED] ondansetron **OR** ondansetron (ZOFRAN) IV Anti-infectives (From admission, onward)   Start     Dose/Rate Route Frequency Ordered Stop   06/25/19 1430  rifaximin (XIFAXAN) tablet 550 mg     550 mg Oral 2 times daily 06/25/19 1429     06/24/19 1000  cefTRIAXone (ROCEPHIN) 1 g in sodium chloride 0.9 % 100 mL IVPB     1 g 200 mL/hr over 30 Minutes Intravenous Every 24 hours 07/09/2019 1410     07/07/2019 1330  cefTRIAXone (ROCEPHIN) 2 g in sodium chloride 0.9 % 100 mL IVPB     2 g 200 mL/hr over 30 Minutes Intravenous  Once 07/02/2019 1321 06/16/2019 1428     Scheduled Meds:  sodium  chloride   Intravenous Once   chlorhexidine gluconate (MEDLINE KIT)  15 mL Mouth Rinse BID   Chlorhexidine Gluconate Cloth  6 each Topical Q0600   hydrocortisone sod succinate (SOLU-CORTEF) inj  50 mg Intravenous Q8H   insulin aspart  0-15 Units Subcutaneous Q4H   lactulose  20 g Oral Daily   mouth rinse  15 mL Mouth Rinse 10 times per day   pantoprazole  40 mg Intravenous Q12H   rifaximin  550 mg Oral BID   sodium chloride flush  3 mL Intravenous Q12H   Continuous Infusions:  cefTRIAXone (ROCEPHIN)  IV 1 g (06/25/19 1046)   midazolam 2 mg/hr (06/25/19 0600)   norepinephrine (LEVOPHED) Adult infusion 15 mcg/min (06/25/19 1155)   phytonadione (VITAMIN K) IV 10 mg (06/25/19 1053)   pureflow 2,500 mL/hr at 06/25/19 1223   PRN Meds:.albuterol, fentaNYL (SUBLIMAZE) injection, heparin, midazolam, [DISCONTINUED] ondansetron **OR** ondansetron (ZOFRAN) IV   Assessment: Active Problems:   Upper GI bleed   Acute GI bleeding  Hemorrhagic shock, cardiac arrest, multiorgan failure Decompensated alcoholic cirrhosis No active bleeding at this time  Plan: Upper GI bleed, likely variceal bleed: No active bleed at this time, normal hemoglobin I do not recommend any endoscopic intervention at this time as patient is critically ill Patient received octreotide drip for 48 hours Currently on IV Protonix Continue ceftriaxone for SBP prophylaxis Monitor CBC, maintain Hb >7, plts > 50 Patient is too sick for TIPS procedure  Acute liver failure Combination of shock liver and alcoholic liver disease Recommend to start lactulose and rifaximin Monitor LFTs Ultrasound liver Doppler ordered Viral hepatitis panel pending HIV negative  AKI On CRRT  Overall prognosis guarded  Dr. Vicente Males will cover from tomorrow   LOS: 2 days   Murriel Holwerda 06/25/2019, 2:30 PM

## 2019-06-25 NOTE — Progress Notes (Signed)
PULMONARY/CCM PROGRESS NOTE  PT PROFILE: 53 M alcoholic with previously undiagnosed cirrhosis admitted via ED with brief out of hospital cardiac arrest after witnessed massive hematemesis.  In ED, profoundly hypotensive and received 6 units RBCs, 4 units FFP, 1 unit platelets on massive transfusion protocol.  He is a heavy drinker and takes large volumes of nonsteroidals.  MAJOR EVENTS/TEST RESULTS: 08/14 admission as documented above 08/14 gastroenterology consultation.  Deemed too unstable for EGD 08/15 RUQ [place]: Markedly diffusely echogenic liver. This could be due to steatosis, cirrhosis or chronic hepatitis. Small amount of ascites. Diffuse gallbladder wall thickening  08/15 nephrology consultation: CRRT initiated 08/15 intermittently agitated.  Low-dose midazolam infusion initiated  INDWELLING DEVICES:: R femoral cordis 08/14 >> 08/15 ETT 08/14 >>  R IJ CVL 08/14 >>  L IJ HD cath 08/14 >>   MICRO DATA:  SARS-CoV-2 PCR 8/14 >> NEG  Blood 8/14 >>   ANTIMICROBIALS:  Ceftriaxone 08/14 >>   SUBJ: RASS -3 on midaz infusion. Not F/C.  Intermittently dyssynchronous with ventilator  OBJ: Vitals:   06/25/19 1100 06/25/19 1157 06/25/19 1200 06/25/19 1300  BP: (!) 81/55  (!) 87/52 (!) 90/53  Pulse: 86  83 85  Resp: '14  15 14  '$ Temp: 97.9 F (36.6 C)  97.9 F (36.6 C) 98.2 F (36.8 C)  TempSrc:      SpO2: 92% 92% 93% 93%  Weight:      Height:       Vent Mode: PCV FiO2 (%):  [50 %-60 %] 60 % Set Rate:  [12 bmp-20 bmp] 12 bmp Vt Set:  [500 mL] 500 mL PEEP:  [5 cmH20] 5 cmH20   Gen: intubated, RASS -3, not following commands, severe jaundice HEENT: Severe scleral icterus, NCAT Neck: No LAN, no JVD noted Lungs: Scattered bilateral rhonchi, no wheezes Cardiovascular: Regular, no M Abdomen: Mildly distended, NT, diminished to absent BS Ext: Warm, no edema Neuro: PERRL, EOMI, moves all 4 extremities Skin: Severe jaundice, extensive ecchymoses on BUE   BMP Latest Ref Rng &  Units 06/25/2019 06/25/2019 06/24/2019  Glucose 70 - 99 mg/dL 99 97 80  BUN 8 - 23 mg/dL 66(H) 73(H) 71(H)  Creatinine 0.61 - 1.24 mg/dL UNABLE TO REPORT DUE TO ICTERUS 3.35(H) ICTERUS AT THIS LEVEL MAY AFFECT RESULT  BUN/Creat Ratio 9 - 20 - - -  Sodium 135 - 145 mmol/L 133(L) 132(L) 134(L)  Potassium 3.5 - 5.1 mmol/L 4.4 4.4 3.3(L)  Chloride 98 - 111 mmol/L 98 98 106  CO2 22 - 32 mmol/L 22 23 17(L)  Calcium 8.9 - 10.3 mg/dL 7.6(L) 7.4(L) 5.8(LL)    Hepatic Function Latest Ref Rng & Units 06/25/2019 06/25/2019 06/24/2019  Total Protein 6.5 - 8.1 g/dL 5.7(L) - -  Albumin 3.5 - 5.0 g/dL 2.1(L) 2.1(L) 1.7(L)  AST 15 - 41 U/L 317(H) - -  ALT 0 - 44 U/L UNABLE TO REPORT DUE TO ICTERUS - -  Alk Phosphatase 38 - 126 U/L 175(H) - -  Total Bilirubin 0.3 - 1.2 mg/dL 31.1(HH) - -  Bilirubin, Direct 0.0 - 0.2 mg/dL 17.1(H) - -    CBC Latest Ref Rng & Units 06/25/2019 06/24/2019 06/24/2019  WBC 4.0 - 10.5 K/uL 11.9(H) 9.3 8.4  Hemoglobin 13.0 - 17.0 g/dL 14.1 13.5 13.7  Hematocrit 39.0 - 52.0 % 38.7(L) 36.3(L) 36.8(L)  Platelets 150 - 400 K/uL 96(L) 101(L) 113(L)      ABG    Component Value Date/Time   PHART 7.47 (H) 06/24/2019 0451  PCO2ART 31 (L) 06/24/2019 0451   PO2ART 81 (L) 06/24/2019 0451   HCO3 22.6 06/24/2019 0451   ACIDBASEDEF 0.2 06/24/2019 0451   O2SAT 96.6 06/24/2019 0451     CXR: Low lung volumes, bibasilar opacities, suspect atelectasis   IMPRESSION: 1) Admitted with massive UGIB, hemorrhagic shock, brief out of hosp cardiac arrest 2) Ventilator dependent respiratory failure. Intubated due to AMS after brief cardiac arrest 3) Oliguric AKI 4) Metabolic acidosis - resolved on CRRT 5) Hyperkalemia, resolved - resolved on CRRT 6) Mild hyponatremia 7) UGIB - variceal vs gastric (heavy alcohol abuse, heavy NSAIDs use)  Appears to be resolved 8) Acute liver failure 9) Likely underlying alcoholic cirrhosis 7) Suspect ascites 8) Acute blood loss anemia - not overtly  bleeding at present time 9) Mild thrombocytopenia 10) Coagulopathy due to liver failure 11) Acute encephalopathy, multifactorial 12) history of heavy alcohol abuse prior to admission 13) ICU/ventilator associated discomfort  PLAN/REC: Cont vent support - settings reviewed and/or adjusted Cont vent bundle Daily SBT if/when meets criteria ICU hemodynamic monitoring Wean norepinephrine to off for MAP >65 mmHg DC vasopressin 08/16 Cont hydrocortisone initiated8/15. Dose decreased 08/16 Monitor BMET intermittently Monitor I/Os Correct electrolytes as indicated Cont  CRRT - initiated 8/15 Cont IV pantoprazole DC octreotide infusion 08/16 Cont to hold on TF's for now Moderate scale SSI ordered (while on systemic steroids) Monitor temp, WBC count Micro and abx as above DVT px: SCDs Monitor CBC intermittently Transfuse per usual guidelines RASS goal -1,-2 Cont PAD protocol initiated 08/15 - midaz infusion, PRN fentanyl  I again updated the patient's wife in detail.  I indicated that he had had a relatively uneventful 24 hours but that his prognosis remains very guarded.  All of her questions were answered.   CCM time: 40 mins The above time includes time spent in consultation with patient and/or family members and reviewing care plan on multidisciplinary rounds  Merton Border, MD PCCM service Mobile [phone] Pager [phone] 06/25/2019 1:43 PM

## 2019-06-25 NOTE — Progress Notes (Signed)
[facility] [place] NAME: Jacier Gladu    MR#:  [id number]  DATE OF BIRTH:  [birth year]  SUBJECTIVE:  CHIEF COMPLAINT:   Chief Complaint  Patient presents with  . GI Bleeding   Sedated on vent On CRRT Continues to be hypotensive Sedated  REVIEW OF SYSTEMS:    Review of Systems  Unable to perform ROS: Intubated   DRUG ALLERGIES:  No Known Allergies  VITALS:  Blood pressure 109/63, pulse 80, temperature (!) 97.3 F (36.3 C), temperature source Bladder, resp. rate 20, height 5\' 4"  (1.626 m), weight 90.1 kg, SpO2 92 %.  PHYSICAL EXAMINATION:   Physical Exam  GENERAL:  63 y.o.-year-old patient lying in the bed. Sedated. ETT.  Jaundiced EYES: Pupils equal, round, reactive to light and accommodation. +scleral icterus. Extraocular muscles intact.  HEENT: Head atraumatic, normocephalic. Oropharynx and nasopharynx clear.  NECK:  Supple, no jugular venous distention. No thyroid enlargement, no tenderness.  LUNGS: Normal breath sounds bilaterally.  CARDIOVASCULAR: S1, S2 normal. No murmurs, rubs, or gallops.  ABDOMEN: Soft, nontender, nondistended. Bowel sounds present. No organomegaly or mass.  EXTREMITIES: No cyanosis, clubbing or edema b/l.    PSYCHIATRIC: The patient is sedated. SKIN: No obvious rash, lesion, or ulcer.   LABORATORY PANEL:   CBC Recent Labs  Lab 06/25/19 0608  WBC 11.9*  HGB 14.1  HCT 38.7*  PLT 96*   ------------------------------------------------------------------------------------------------------------------ Chemistries  Recent Labs  Lab 06/25/19 0608  NA 133*  K 4.4  CL 98  CO2 22  GLUCOSE 99  BUN 66*  CREATININE UNABLE TO REPORT DUE TO ICTERUS  CALCIUM 7.6*  MG 2.1  AST 317*  ALT UNABLE TO REPORT DUE TO ICTERUS  ALKPHOS 175*  BILITOT 31.1*   ------------------------------------------------------------------------------------------------------------------  Cardiac Enzymes No results for  input(s): TROPONINI in the last 168 hours. ------------------------------------------------------------------------------------------------------------------  RADIOLOGY:  Dg Abd 1 View  Result Date: 06/22/2019 CLINICAL DATA:  Orogastric tube placement. EXAM: ABDOMEN - 1 VIEW COMPARISON:  Earlier today. FINDINGS: Interval orogastric tube with its tip in the proximal to mid stomach and side hole in the proximal stomach. Normal bowel gas pattern. Mild thoracolumbar spine degenerative changes. IMPRESSION: Orogastric tube tip in the proximal to mid stomach and side hole in the proximal stomach. Electronically Signed   By: Claudie Revering M.D.   On: 06/12/2019 17:57   Dg Abdomen 1 View  Result Date: 06/19/2019 CLINICAL DATA:  Check Blakemore catheter placement EXAM: ABDOMEN - 1 VIEW COMPARISON:  None. FINDINGS: Scattered large and small bowel gas is noted. catheter is noted within the mid to distal esophagus. The tip is 8 cm from the stomach. IMPRESSION: Blakemore catheter is noted in the mid to distal esophagus. Advancement is recommended. Electronically Signed   By: Inez Catalina M.D.   On: 07/10/2019 15:45   Dg Chest Port 1 View  Result Date: 06/25/2019 CLINICAL DATA:  Elevated liver function tests EXAM: PORTABLE CHEST 1 VIEW COMPARISON:  Yesterday FINDINGS: Endotracheal tube tip at the clavicular heads. Bilateral IJ line with tips at the SVC. The orogastric tube reaches the stomach at least. Low volume chest with interstitial crowding/atelectasis. Stable heart size. No pneumothorax. IMPRESSION: Stable hardware positioning and low volume chest with basilar opacification. Electronically Signed   By: Monte Fantasia M.D.   On: 06/25/2019 07:44   Dg Chest Port 1 View  Result Date: 06/24/2019 CLINICAL DATA:  Respiratory failure. EXAM: PORTABLE CHEST 1 VIEW COMPARISON:  06/22/2019 FINDINGS: There is a  right IJ catheter with tip projecting over the SVC. Left IJ catheter is also noted with tip projecting over  the SVC. Enteric tube is in place with side port below GE junction. The ET tube tip is above the carina. Normal heart size. Pulmonary vascular congestion is similar to previous exam. IMPRESSION: 1. Pulmonary vascular congestion, similar. 2. Support apparatus positioned as above. Electronically Signed   By: Kerby Moors M.D.   On: 06/24/2019 08:54   Dg Chest Port 1 View  Result Date: 07/09/2019 CLINICAL DATA:  Central line placement. EXAM: PORTABLE CHEST 1 VIEW COMPARISON:  Chest x-ray from same day at 1:27 p.m. FINDINGS: New left internal jugular central venous catheter with the tip in the distal SVC. New right internal jugular central venous catheter with the tip in the mid SVC. Unchanged endotracheal tube with the tip 5.2 cm above the carina. New Blakemore tube with the tip in the distal esophagus. The heart size and mediastinal contours are within normal limits. Normal pulmonary vascularity. Low lung volumes with mild bibasilar atelectasis. No focal consolidation, pleural effusion, or pneumothorax. No acute osseous abnormality. IMPRESSION: 1. New bilateral internal jugular central venous catheters without complicating feature. 2. New Blakemore tube with the tip in the distal esophagus. Electronically Signed   By: Titus Dubin M.D.   On: 06/13/2019 17:59   Dg Chest Port 1 View  Result Date: 06/17/2019 CLINICAL DATA:  Hypoxia EXAM: PORTABLE CHEST 1 VIEW COMPARISON:  None. FINDINGS: Endotracheal tube tip is 3.4 cm above the carina. Nasogastric tube tip and side port are below the diaphragm. No pneumothorax. There is no evident edema or consolidation. Heart size and pulmonary vascularity are normal. No adenopathy. No bone lesions. IMPRESSION: Tube positions as described without pneumothorax. No edema or consolidation. Cardiac silhouette within normal limits. Electronically Signed   By: Lowella Grip III M.D.   On: 07/07/2019 13:45   US Abdomen Limited Ruq  Result Date: 06/24/2019 CLINICAL DATA:   Elevated liver function tests. EXAM: ULTRASOUND ABDOMEN LIMITED RIGHT UPPER QUADRANT COMPARISON:  None. FINDINGS: Gallbladder: Mild diffuse wall thickening with a maximum thickness of 5 mm. No visible gallstones or pericholecystic fluid. No sonographic Murphy sign. Common bile duct: Diameter: 5.1 mm, poorly visualized. Liver: Markedly diffusely echogenic. No visible mass. The portal vein cannot be adequately visualized to assess flow. Other: Small amount of free peritoneal fluid. IMPRESSION: 1. Markedly diffusely echogenic liver. This could be due to steatosis, cirrhosis or chronic hepatitis. 2. Small amount of ascites. 3. Diffuse gallbladder wall thickening. In the absence of visualized gallstones or a sonographic Murphy sign, this may be due to hypoproteinemia, acute hepatitis or chronic acalculous cholecystitis. Electronically Signed   By: Claudie Revering M.D.   On: 06/24/2019 13:34     ASSESSMENT AND PLAN:   * Upper GI bleed likely variceal bleed Bleeding has stopped at this time. Continue Protonix and octreotide drip. 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 MOnitor  *Cardiac arrest secondary to hypovolemic shock. Presently intubated.  *Acute kidney injury with hyperkalemia.   On CRRT  Poor prognosis with multiorgan failure at this time.  All the records are reviewed and case discussed with Care Management/Social Worker Management plans discussed with the patient, family and they are in agreement.  CODE STATUS: DNR  DVT Prophylaxis: SCDs  TOTAL TIME TAKING CARE OF THIS PATIENT: 35 minutes.   Neita Carp M.D on 06/25/2019 at 1:27 PM  Between 7am  to 6pm - Pager - [phone]  After 6pm go to [email or web address] - password [email or web address] [facility]  Office  [phone]  CC: Primary care physician; Guadalupe Maple, MD  Note: This dictation was prepared with  Dragon dictation along with smaller phrase technology. Any transcriptional errors that result from this process are unintentional.

## 2019-06-25 NOTE — Progress Notes (Signed)
CRRT Medication Management:   Pharmacy consulted for CRRT medication adjustments for 63 yo male with respiratory failure, hypovolemic shock secondary to GI Bleed, metabolic acidosis. Patient receiving 4K bath. No medication adjustments warranted at this time.   Pharmacy will continue to monitor and adjust per consult.   MLS 8.16.20

## 2019-06-25 NOTE — Progress Notes (Signed)
[facility]  ROUNDING NOTE   Subjective:  Patient remains critically ill at this point in time. Still on CRRT. Urine output only 200 cc over the preceding 24 hours. Till hypotensive with a blood pressure of 90/53.   Objective:  Vital signs in last 24 hours:  Temp:  [95.9 F (35.5 C)-99 F (37.2 C)] 98.2 F (36.8 C) (08/16 1300) Pulse Rate:  [75-87] 85 (08/16 1300) Resp:  [10-24] 14 (08/16 1300) BP: (81-123)/(52-75) 90/53 (08/16 1300) SpO2:  [90 %-100 %] 93 % (08/16 1300) FiO2 (%):  [50 %-60 %] 60 % (08/16 1157) Weight:  [90.1 kg] 90.1 kg (08/16 0420)  Weight change: -0.619 kg Filed Weights   06/19/2019 1245 06/24/19 0500 06/25/19 0420  Weight: 90.7 kg 87.3 kg 90.1 kg    Intake/Output: I/O last 3 completed shifts: In: 4752.9 [I.V.:4602.9; IV Piggyback:150] Out: 650 [Urine:350; Emesis/NG output:300]   Intake/Output this shift:  Total I/O In: 134.9 [I.V.:104.9; NG/GT:30] Out: -   Physical Exam: General: Critically ill-appearing  Head: Endotracheal tube in place.  Eyes: Icterus noted.  Neck: Supple, trachea midline  Lungs:  Scattered rhonchi, vent assisted  Heart: S1S2 no rubs  Abdomen:  Soft, nontender, bowel sounds present  Extremities: 1+ peripheral edema.  Neurologic: Intubated, sedated  Skin: Jaundice noted  Access: Left IJ temporary dialysis catheter    Basic Metabolic Panel: Recent Labs  Lab 07/02/2019 2140  06/24/19 1424 06/24/19 1439 06/24/19 1837 06/24/19 2228 06/25/19 0327 06/25/19 0608  NA 129*   < > 132*  --  133* 134* 132* 133*  K 4.5   < > 4.2  --  4.2 3.3* 4.4 4.4  CL 92*   < > 93*  --  96* 106 98 98  CO2 19*   < > 23  --  23 17* 23 22  GLUCOSE 181*   < > 92  --  95 80 97 99  BUN 114*   < > 116*  --  95* 71* 73* 66*  CREATININE UNABLE TO REPORT DUE TO ICTERIC INTERFERENCE   < > UNABLE TO REPORT DUE TO EXTREME ICTERUS. QSD  --  4.73* ICTERUS AT THIS LEVEL MAY AFFECT RESULT 3.35* UNABLE TO REPORT DUE TO ICTERUS  CALCIUM 8.0*    < > 7.3*  --  7.3* 5.8* 7.4* 7.6*  MG 2.4  --   --  2.1 2.0 1.6* 2.0 2.1  PHOS 10.0*  --  6.6*  --  5.9* ICTERUS AT THIS LEVEL MAY AFFECT RESULT 4.9*  --    < > = values in this interval not displayed.    Liver Function Tests: Recent Labs  Lab 06/15/2019 1321 06/18/2019 1715 07/08/2019 2140 06/24/19 0500 06/24/19 1424 06/24/19 1837 06/24/19 2228 06/25/19 0327 06/25/19 0608  AST 160* 258* 233* 257*  --   --   --   --  317*  ALT 50* 77* UNABLE TO REPORT DUE TO ICTERIC INTERFERENCE NOT VALID  --   --   --   --  UNABLE TO REPORT DUE TO ICTERUS  ALKPHOS 228* 218* 163* 164*  --   --   --   --  175*  BILITOT 25.9* 29.1* 27.4* 31.0*  --   --   --   --  31.1*  PROT 4.9* 6.1* 5.5* 5.4*  --   --   --   --  5.7*  ALBUMIN 1.6* 2.3* 2.2* 2.2* 2.2* 2.3* 1.7* 2.1* 2.1*   No results for input(s): LIPASE, AMYLASE  in the last 168 hours. Recent Labs  Lab 06/27/2019 1715  AMMONIA 51*    CBC: Recent Labs  Lab 06/22/2019 1321 07/01/2019 1700 06/24/19 0145 06/24/19 0500 06/25/19 0608  WBC 17.0* 10.6* 8.4 9.3 11.9*  NEUTROABS 10.7*  --   --   --   --   HGB 7.1* 15.1 13.7 13.5 14.1  HCT 20.5* 43.5 36.8* 36.3* 38.7*  MCV 116.5* 93.8 88.0 86.6 91.1  PLT 239 128* 113* 101* 96*    Cardiac Enzymes: No results for input(s): CKTOTAL, CKMB, CKMBINDEX, TROPONINI in the last 168 hours.  BNP: Invalid input(s): POCBNP  CBG: Recent Labs  Lab 06/24/19 2015 06/24/19 2330 06/25/19 0408 06/25/19 0759 06/25/19 1228  GLUCAP 91 100* 95 108* 98    Microbiology: Results for orders placed or performed during the hospital encounter of 06/13/2019  Blood Culture (routine x 2)     Status: None (Preliminary result)   Collection Time: 06/17/2019 12:38 PM   Specimen: BLOOD  Result Value Ref Range Status   Specimen Description BLOOD LEFT ANTECUBITAL  Final   Special Requests   Final    BOTTLES DRAWN AEROBIC AND ANAEROBIC Blood Culture adequate volume   Culture   Final    NO GROWTH 2 DAYS Performed at [facility], [address] [place],  [address]    Report Status PENDING  Incomplete  SARS Coronavirus 2 [facility] order, Performed in [place] hospital lab) Nasopharyngeal Nasopharyngeal Swab     Status: None   Collection Time: 06/14/2019 12:58 PM   Specimen: Nasopharyngeal Swab  Result Value Ref Range Status   SARS Coronavirus 2 NEGATIVE NEGATIVE Final    Comment: (NOTE) If result is NEGATIVE SARS-CoV-2 target nucleic acids are NOT DETECTED. The SARS-CoV-2 RNA is generally detectable in upper and lower  respiratory specimens during the acute phase of infection. The lowest  concentration of SARS-CoV-2 viral copies this assay can detect is 250  copies / mL. A negative result does not preclude SARS-CoV-2 infection  and should not be used as the sole basis for treatment or other  patient management decisions.  A negative result may occur with  improper specimen collection / handling, submission of specimen other  than nasopharyngeal swab, presence of viral mutation(s) within the  areas targeted by this assay, and inadequate number of viral copies  (<250 copies / mL). A negative result must be combined with clinical  observations, patient history, and epidemiological information. If result is POSITIVE SARS-CoV-2 target nucleic acids are DETECTED. The SARS-CoV-2 RNA is generally detectable in upper and lower  respiratory specimens dur ing the acute phase of infection.  Positive  results are indicative of active infection with SARS-CoV-2.  Clinical  correlation with patient history and other diagnostic information is  necessary to determine patient infection status.  Positive results do  not rule out bacterial infection or co-infection with other viruses. If result is PRESUMPTIVE POSTIVE SARS-CoV-2 nucleic acids MAY BE PRESENT.   A presumptive positive result was obtained on the submitted specimen  and confirmed on repeat testing.  While 2019 novel coronavirus  (SARS-CoV-2)  nucleic acids may be present in the submitted sample  additional confirmatory testing may be necessary for epidemiological  and / or clinical management purposes  to differentiate between  SARS-CoV-2 and other Sarbecovirus currently known to infect humans.  If clinically indicated additional testing with an alternate test  methodology [phone]) is advised. The SARS-CoV-2 RNA is generally  detectable in upper and lower respiratory  sp ecimens during the acute  phase of infection. The expected result is Negative. Fact Sheet for Patients:  StrictlyIdeas.no Fact Sheet for Healthcare Providers: BankingDealers.co.za This test is not yet approved or cleared by the [place] FDA and has been authorized for detection and/or diagnosis of SARS-CoV-2 by FDA under an Emergency Use Authorization (EUA).  This EUA will remain in effect (meaning this test can be used) for the duration of the COVID-19 declaration under Section 564(b)(1) of the Act, 21 U.S.C. section 360bbb-3(b)(1), unless the authorization is terminated or revoked sooner. Performed at [facility], [address] [place], [place] [address]   Blood Culture (routine x 2)     Status: None (Preliminary result)   Collection Time: 06/20/2019  6:59 PM   Specimen: BLOOD  Result Value Ref Range Status   Specimen Description BLOOD BLOOD RIGHT HAND  Final   Special Requests   Final    BOTTLES DRAWN AEROBIC AND ANAEROBIC Blood Culture results may not be optimal due to an inadequate volume of blood received in culture bottles   Culture   Final    NO GROWTH 2 DAYS Performed at [facility], [address] [place], [place] [address]    Report Status PENDING  Incomplete    Coagulation Studies: Recent Labs    06/19/2019 1321 06/10/2019 1720 06/19/2019 2140 06/24/19 0500 06/25/19 0608  LABPROT 24.7* 19.0* 18.8* 17.5* 17.8*  INR 2.3* 1.6* 1.6* 1.5* 1.5*    Urinalysis: Recent  Labs    06/24/19 0500  COLORURINE RED*  LABSPEC 1.020  PHURINE TEST NOT REPORTED DUE TO COLOR INTERFERENCE OF URINE PIGMENT  GLUCOSEU TEST NOT REPORTED DUE TO COLOR INTERFERENCE OF URINE PIGMENT*  HGBUR TEST NOT REPORTED DUE TO COLOR INTERFERENCE OF URINE PIGMENT*  BILIRUBINUR TEST NOT REPORTED DUE TO COLOR INTERFERENCE OF URINE PIGMENT*  KETONESUR TEST NOT REPORTED DUE TO COLOR INTERFERENCE OF URINE PIGMENT*  PROTEINUR TEST NOT REPORTED DUE TO COLOR INTERFERENCE OF URINE PIGMENT*  NITRITE TEST NOT REPORTED DUE TO COLOR INTERFERENCE OF URINE PIGMENT*  LEUKOCYTESUR TEST NOT REPORTED DUE TO COLOR INTERFERENCE OF URINE PIGMENT*      Imaging: Dg Abd 1 View  Result Date: 07/03/2019 CLINICAL DATA:  Orogastric tube placement. EXAM: ABDOMEN - 1 VIEW COMPARISON:  Earlier today. FINDINGS: Interval orogastric tube with its tip in the proximal to mid stomach and side hole in the proximal stomach. Normal bowel gas pattern. Mild thoracolumbar spine degenerative changes. IMPRESSION: Orogastric tube tip in the proximal to mid stomach and side hole in the proximal stomach. Electronically Signed   By: Claudie Revering M.D.   On: 07/02/2019 17:57   Dg Abdomen 1 View  Result Date: 06/16/2019 CLINICAL DATA:  Check Blakemore catheter placement EXAM: ABDOMEN - 1 VIEW COMPARISON:  None. FINDINGS: Scattered large and small bowel gas is noted. catheter is noted within the mid to distal esophagus. The tip is 8 cm from the stomach. IMPRESSION: Blakemore catheter is noted in the mid to distal esophagus. Advancement is recommended. Electronically Signed   By: Inez Catalina M.D.   On: 06/16/2019 15:45   Dg Chest Port 1 View  Result Date: 06/25/2019 CLINICAL DATA:  Elevated liver function tests EXAM: PORTABLE CHEST 1 VIEW COMPARISON:  Yesterday FINDINGS: Endotracheal tube tip at the clavicular heads. Bilateral IJ line with tips at the SVC. The orogastric tube reaches the stomach at least. Low volume chest with interstitial  crowding/atelectasis. Stable heart size. No pneumothorax. IMPRESSION: Stable hardware positioning and low  volume chest with basilar opacification. Electronically Signed   By: Monte Fantasia M.D.   On: 06/25/2019 07:44   Dg Chest Port 1 View  Result Date: 06/24/2019 CLINICAL DATA:  Respiratory failure. EXAM: PORTABLE CHEST 1 VIEW COMPARISON:  06/20/2019 FINDINGS: There is a right IJ catheter with tip projecting over the SVC. Left IJ catheter is also noted with tip projecting over the SVC. Enteric tube is in place with side port below GE junction. The ET tube tip is above the carina. Normal heart size. Pulmonary vascular congestion is similar to previous exam. IMPRESSION: 1. Pulmonary vascular congestion, similar. 2. Support apparatus positioned as above. Electronically Signed   By: Kerby Moors M.D.   On: 06/24/2019 08:54   Dg Chest Port 1 View  Result Date: 06/13/2019 CLINICAL DATA:  Central line placement. EXAM: PORTABLE CHEST 1 VIEW COMPARISON:  Chest x-ray from same day at 1:27 p.m. FINDINGS: New left internal jugular central venous catheter with the tip in the distal SVC. New right internal jugular central venous catheter with the tip in the mid SVC. Unchanged endotracheal tube with the tip 5.2 cm above the carina. New Blakemore tube with the tip in the distal esophagus. The heart size and mediastinal contours are within normal limits. Normal pulmonary vascularity. Low lung volumes with mild bibasilar atelectasis. No focal consolidation, pleural effusion, or pneumothorax. No acute osseous abnormality. IMPRESSION: 1. New bilateral internal jugular central venous catheters without complicating feature. 2. New Blakemore tube with the tip in the distal esophagus. Electronically Signed   By: Titus Dubin M.D.   On: 07/07/2019 17:59   US Abdomen Limited Ruq  Result Date: 06/24/2019 CLINICAL DATA:  Elevated liver function tests. EXAM: ULTRASOUND ABDOMEN LIMITED RIGHT UPPER QUADRANT COMPARISON:  None.  FINDINGS: Gallbladder: Mild diffuse wall thickening with a maximum thickness of 5 mm. No visible gallstones or pericholecystic fluid. No sonographic Murphy sign. Common bile duct: Diameter: 5.1 mm, poorly visualized. Liver: Markedly diffusely echogenic. No visible mass. The portal vein cannot be adequately visualized to assess flow. Other: Small amount of free peritoneal fluid. IMPRESSION: 1. Markedly diffusely echogenic liver. This could be due to steatosis, cirrhosis or chronic hepatitis. 2. Small amount of ascites. 3. Diffuse gallbladder wall thickening. In the absence of visualized gallstones or a sonographic Murphy sign, this may be due to hypoproteinemia, acute hepatitis or chronic acalculous cholecystitis. Electronically Signed   By: Claudie Revering M.D.   On: 06/24/2019 13:34     Medications:   . cefTRIAXone (ROCEPHIN)  IV 1 g (06/25/19 1046)  . midazolam 2 mg/hr (06/25/19 0600)  . norepinephrine (LEVOPHED) Adult infusion 15 mcg/min (06/25/19 1155)  . phytonadione (VITAMIN K) IV 10 mg (06/25/19 1053)  . pureflow 2,500 mL/hr at 06/25/19 1223   . sodium chloride   Intravenous Once  . chlorhexidine gluconate (MEDLINE KIT)  15 mL Mouth Rinse BID  . Chlorhexidine Gluconate Cloth  6 each Topical Q0600  . hydrocortisone sod succinate (SOLU-CORTEF) inj  50 mg Intravenous Q8H  . insulin aspart  0-15 Units Subcutaneous Q4H  . mouth rinse  15 mL Mouth Rinse 10 times per day  . pantoprazole  40 mg Intravenous Q12H  . sodium chloride flush  3 mL Intravenous Q12H   albuterol, fentaNYL (SUBLIMAZE) injection, heparin, midazolam, [DISCONTINUED] ondansetron **OR** ondansetron (ZOFRAN) IV  Assessment/ Plan:  63 y.o. male with a PMHx of alcohol abuse, psoriasis, tubular adenoma of the colon, tobacco abuse who was admitted to [facility] on 06/15/2019 for evaluation of severe  GI bleeding/cardiac arrest.   1.  Severe acute renal failure secondary to hypovolemic shock. 2.  Hypovolemic shock due to GI bleed. 3.   Metabolic acidosis. 4.  Acute respiratory failure. 5.  Recent cardiac arrest. 6.  Alcohol abuse.  Plan: Patient remains critically ill at this point time.  He continues to have significant shock and remains on vasopressors.  Urine output only 200 cc over the receiving 24 hours.  At this point time we will maintain the patient on CRRT.  Blood pressure still a bit low therefore we will hold off on ultrafiltration for now.  Consider adding this tomorrow if blood pressure stabilizes.  Overall prognosis remains quite guarded.   LOS: 2 Ervie Mccard 8/16/20201:46 PM

## 2019-06-25 NOTE — Progress Notes (Signed)
Initial Nutrition Assessment  DOCUMENTATION CODES:   Obesity unspecified  INTERVENTION:  Plan is to hold off on initiation of enteral nutrition at this time.  Once patient appropriate for enteral nutrition recommend initiating Vital High Protein at 20 mL/hr and advancing by 15 mL/hr every 8 hours to goal rate of 50 mL/hr (1200 mL goal daily volume). Also provide Pro-Stat 30 mL once daily per tube. Goal regimen provides 1300 kcal, 120 grams of protein, 1008 mL H2O daily.  If tube feeds are initiated provide minimum free water flush of 30 mL Q4hrs and a B-complex with C daily per tube.  NUTRITION DIAGNOSIS:   Inadequate oral intake related to inability to eat as evidenced by NPO status.  GOAL:   Provide needs based on ASPEN/SCCM guidelines  MONITOR:   Vent status, Labs, Weight trends, TF tolerance, I & O's  REASON FOR ASSESSMENT:   Ventilator    ASSESSMENT:   63 year old male with PMHx of BPH, tubular adenoma of colon, psoriasis, previously undiagnosed cirrhosis, EtOH abuse, admitted after brief out of hospital cardiac arrest after witnessed massive hematemesis, with hemorrhagic shock s/p massive transfusion protocol, upper GI bleed in setting of heavy EtOH abuse and heavy NSAIDs use, intubated due to AMS on 8/14, also with oliguric AKI, metabolic acidosis, acute liver failure in setting of suspected alcoholic cirrhosis and suspected ascites.   Patient intubated and sedated. On PS/CPAP mode with FiO2 60% and PEEP 5 cmH2O. Abdomen distended but soft per RN documentation. Last BM unknown/PTA. Patient on CVVHD with UF of 0. Limited weight history in chart to trend. Patient does not meet criteria for malnutrition at this time.  Enteral Access: 16 Fr. OGT placed 8/14; terminates in stomach per abdominal x-ray 8/14; 60 cm at corner of mouth  MAP: 70-82  Patient is currently intubated on ventilator support Ve: 9.2 L/min Temp (24hrs), Avg:97.7 F (36.5 C), Min:95.9 F (35.5 C),  Max:99.5 F (37.5 C)  Propofol: N/A  Medications reviewed and include: Solu-Cortef 50 mg Q8hrs IV, Novolog 0-15 units Q4hrs, pantoprazole, ceftriaxone, Versed gtt, norepinephrine gtt at 10 mcg/min.  Labs reviewed: CBG 95-108, Sodium 133, BUN 66, Creatinine unable to report, AST 317, ALT unable to report, D Bili 17.1, T Bili 31.1.  I/O: 200 mL UOP yesterday (0.1 mL/kg/hr)  Discussed with RN.  NUTRITION - FOCUSED PHYSICAL EXAM:    Most Recent Value  Orbital Region  No depletion  Upper Arm Region  No depletion  Thoracic and Lumbar Region  Unable to assess  Buccal Region  Unable to assess  Temple Region  No depletion  Clavicle Bone Region  No depletion  Clavicle and Acromion Bone Region  No depletion  Scapular Bone Region  Unable to assess  Dorsal Hand  No depletion  Patellar Region  No depletion  Anterior Thigh Region  No depletion  Posterior Calf Region  No depletion  Edema (RD Assessment)  Mild  Hair  Reviewed  Eyes  Unable to assess  Mouth  Unable to assess  Skin  Reviewed  Nails  Reviewed     Diet Order:   Diet Order    None     EDUCATION NEEDS:   No education needs have been identified at this time  Skin:  Skin Assessment: Reviewed RN Assessment  Last BM:  Unkown/PTA  Height:   Ht Readings from Last 1 Encounters:  06/29/2019 5\' 4"  (1.626 m)   Weight:   Wt Readings from Last 1 Encounters:  06/25/19 90.1 kg  Ideal Body Weight:  59.1 kg  BMI:  Body mass index is 34.1 kg/m.  Estimated Nutritional Needs:   Kcal:  [phone] (11-14 kcal/kg)  Protein:  118 grams (2 grams/kg IBW)  Fluid:  1.8 L/day  Willey Blade, MS, RD, LDN Office: [phone] Pager: [phone] After Hours/Weekend Pager: [phone]

## 2019-06-26 ENCOUNTER — Inpatient Hospital Stay: Payer: 59

## 2019-06-26 DIAGNOSIS — K7040 Alcoholic hepatic failure without coma: Secondary | ICD-10-CM

## 2019-06-26 LAB — RENAL FUNCTION PANEL
Albumin: 1.8 g/dL — ABNORMAL LOW (ref 3.5–5.0)
Albumin: 1.8 g/dL — ABNORMAL LOW (ref 3.5–5.0)
Albumin: 1.9 g/dL — ABNORMAL LOW (ref 3.5–5.0)
Albumin: 2 g/dL — ABNORMAL LOW (ref 3.5–5.0)
Anion gap: 13 (ref 5–15)
Anion gap: 16 — ABNORMAL HIGH (ref 5–15)
Anion gap: 8 (ref 5–15)
Anion gap: 9 (ref 5–15)
BUN: 34 mg/dL — ABNORMAL HIGH (ref 8–23)
BUN: 36 mg/dL — ABNORMAL HIGH (ref 8–23)
BUN: 39 mg/dL — ABNORMAL HIGH (ref 8–23)
BUN: 42 mg/dL — ABNORMAL HIGH (ref 8–23)
CO2: 20 mmol/L — ABNORMAL LOW (ref 22–32)
CO2: 21 mmol/L — ABNORMAL LOW (ref 22–32)
CO2: 24 mmol/L (ref 22–32)
CO2: 24 mmol/L (ref 22–32)
Calcium: 7.1 mg/dL — ABNORMAL LOW (ref 8.9–10.3)
Calcium: 7.3 mg/dL — ABNORMAL LOW (ref 8.9–10.3)
Calcium: 7.4 mg/dL — ABNORMAL LOW (ref 8.9–10.3)
Calcium: 7.6 mg/dL — ABNORMAL LOW (ref 8.9–10.3)
Chloride: 101 mmol/L (ref 98–111)
Chloride: 101 mmol/L (ref 98–111)
Chloride: 102 mmol/L (ref 98–111)
Chloride: 93 mmol/L — ABNORMAL LOW (ref 98–111)
Creatinine, Ser: 1.18 mg/dL (ref 0.61–1.24)
Creatinine, Ser: 1.37 mg/dL — ABNORMAL HIGH (ref 0.61–1.24)
Creatinine, Ser: 1.46 mg/dL — ABNORMAL HIGH (ref 0.61–1.24)
Creatinine, Ser: 1.53 mg/dL — ABNORMAL HIGH (ref 0.61–1.24)
GFR calc Af Amer: 56 mL/min — ABNORMAL LOW (ref >60)
GFR calc Af Amer: 59 mL/min — ABNORMAL LOW (ref >60)
GFR calc Af Amer: >60 mL/min (ref >60)
GFR calc Af Amer: >60 mL/min (ref >60)
GFR calc non Af Amer: 48 mL/min — ABNORMAL LOW (ref >60)
GFR calc non Af Amer: 51 mL/min — ABNORMAL LOW (ref >60)
GFR calc non Af Amer: 55 mL/min — ABNORMAL LOW (ref >60)
GFR calc non Af Amer: >60 mL/min (ref >60)
Glucose, Bld: 101 mg/dL — ABNORMAL HIGH (ref 70–99)
Glucose, Bld: 108 mg/dL — ABNORMAL HIGH (ref 70–99)
Glucose, Bld: 108 mg/dL — ABNORMAL HIGH (ref 70–99)
Glucose, Bld: 111 mg/dL — ABNORMAL HIGH (ref 70–99)
Phosphorus: 2.6 mg/dL (ref 2.5–4.6)
Phosphorus: 3.1 mg/dL (ref 2.5–4.6)
Phosphorus: 3.3 mg/dL (ref 2.5–4.6)
Phosphorus: 3.6 mg/dL (ref 2.5–4.6)
Potassium: 4.2 mmol/L (ref 3.5–5.1)
Potassium: 4.5 mmol/L (ref 3.5–5.1)
Potassium: 4.6 mmol/L (ref 3.5–5.1)
Potassium: 4.7 mmol/L (ref 3.5–5.1)
Sodium: 132 mmol/L — ABNORMAL LOW (ref 135–145)
Sodium: 133 mmol/L — ABNORMAL LOW (ref 135–145)
Sodium: 133 mmol/L — ABNORMAL LOW (ref 135–145)
Sodium: 134 mmol/L — ABNORMAL LOW (ref 135–145)

## 2019-06-26 LAB — CBC
HCT: 39.9 % (ref 39.0–52.0)
Hemoglobin: 14 g/dL (ref 13.0–17.0)
MCH: 32.8 pg (ref 26.0–34.0)
MCHC: 35.1 g/dL (ref 30.0–36.0)
MCV: 93.4 fL (ref 80.0–100.0)
Platelets: 78 10*3/uL — ABNORMAL LOW (ref 150–400)
RBC: 4.27 MIL/uL (ref 4.22–5.81)
RDW: 22.3 % — ABNORMAL HIGH (ref 11.5–15.5)
WBC: 11.2 10*3/uL — ABNORMAL HIGH (ref 4.0–10.5)
nRBC: 1.3 % — ABNORMAL HIGH (ref 0.0–0.2)

## 2019-06-26 LAB — PROTIME-INR
INR: 1.4 — ABNORMAL HIGH (ref 0.8–1.2)
Prothrombin Time: 17.1 seconds — ABNORMAL HIGH (ref 11.4–15.2)

## 2019-06-26 LAB — GLUCOSE, CAPILLARY
Glucose-Capillary: 100 mg/dL — ABNORMAL HIGH (ref 70–99)
Glucose-Capillary: 104 mg/dL — ABNORMAL HIGH (ref 70–99)
Glucose-Capillary: 108 mg/dL — ABNORMAL HIGH (ref 70–99)
Glucose-Capillary: 91 mg/dL (ref 70–99)
Glucose-Capillary: 94 mg/dL (ref 70–99)
Glucose-Capillary: 99 mg/dL (ref 70–99)

## 2019-06-26 LAB — MAGNESIUM
Magnesium: 1.9 mg/dL (ref 1.7–2.4)
Magnesium: 2 mg/dL (ref 1.7–2.4)
Magnesium: 2 mg/dL (ref 1.7–2.4)
Magnesium: 2 mg/dL (ref 1.7–2.4)

## 2019-06-26 LAB — PROCALCITONIN: Procalcitonin: 3.09 ng/mL

## 2019-06-26 LAB — MRSA PCR SCREENING: MRSA by PCR: NEGATIVE

## 2019-06-26 MED ORDER — VITAL HIGH PROTEIN PO LIQD: 1000.0000 mL | ORAL | Status: DC

## 2019-06-26 MED ORDER — RIFAXIMIN 550 MG PO TABS
550.0000 mg | ORAL_TABLET | Freq: Two times a day (BID) | ORAL | Status: DC
  Administered 2019-06-26 – 2019-06-28 (×4): 550 mg
  Filled 2019-06-26 (×4): qty 1

## 2019-06-26 MED ORDER — SODIUM CHLORIDE 0.9% FLUSH: 10.0000 mL | INTRAVENOUS | Status: DC | PRN

## 2019-06-26 MED ORDER — SODIUM CHLORIDE 0.9% FLUSH
10.0000 mL | Freq: Two times a day (BID) | INTRAVENOUS | Status: DC
  Administered 2019-06-26 – 2019-06-27 (×2): 10 mL
  Administered 2019-06-28: 10:00:00 20 mL
  Administered 2019-06-28: 23:00:00 10 mL
  Administered 2019-06-29: 30 mL
  Administered 2019-06-29 – 2019-06-30 (×2): 10 mL

## 2019-06-26 MED ORDER — LACTULOSE 10 GM/15ML PO SOLN
20.0000 g | Freq: Two times a day (BID) | ORAL | Status: DC
  Administered 2019-06-26 – 2019-06-28 (×4): 20 g
  Filled 2019-06-26 (×4): qty 30

## 2019-06-26 MED ORDER — VASOPRESSIN 20 UNIT/ML IV SOLN
0.0300 [IU]/min | INTRAVENOUS | Status: DC
  Administered 2019-06-26 – 2019-06-28 (×3): 0.03 [IU]/min via INTRAVENOUS
  Filled 2019-06-26 (×3): qty 2

## 2019-06-26 MED ORDER — SODIUM CHLORIDE 0.9 % IV SOLN
INTRAVENOUS | Status: DC | PRN
  Administered 2019-06-26: 250 mL via INTRAVENOUS

## 2019-06-26 MED FILL — Norepinephrine-NaCl IV Solution 16 MG/250ML-0.9%: INTRAVENOUS | Qty: 250 | Status: AC

## 2019-06-26 NOTE — Progress Notes (Signed)
CRRT Medication Management:   Pharmacy consulted for CRRT medication adjustments for 63 yo male with respiratory failure, hypovolemic shock secondary to GI Bleed, metabolic acidosis. Patient receiving 4K bath. Patient starting ultrafiltration today. No medication adjustments warranted at this time.   Pharmacy will continue to monitor and adjust per consult.   MLS 8.16.20

## 2019-06-26 NOTE — Progress Notes (Signed)
[facility] NAME: Trevor Lopez    MR#:  [id number]  DATE OF BIRTH:  31-Dec-1955  SUBJECTIVE:  CHIEF COMPLAINT:   Chief Complaint  Patient presents with  . GI Bleeding   Sedated on vent On CRRT  No further bleeding noticed  REVIEW OF SYSTEMS:    Review of Systems  Unable to perform ROS: Intubated   DRUG ALLERGIES:  No Known Allergies  VITALS:  Blood pressure (!) 86/56, pulse 71, temperature (!) 97.5 F (36.4 C), temperature source Bladder, resp. rate 13, height 5\' 4"  (1.626 m), weight 90 kg, SpO2 94 %.  PHYSICAL EXAMINATION:   Physical Exam  GENERAL:  63 y.o.-year-old patient lying in the bed. Sedated. ETT.  Jaundiced EYES: Pupils equal, round, reactive to light and accommodation. +scleral icterus. Extraocular muscles intact.  HEENT: Head atraumatic, normocephalic. Oropharynx and nasopharynx clear.  NECK:  Supple, no jugular venous distention. No thyroid enlargement, no tenderness.  LUNGS: Normal breath sounds bilaterally.  CARDIOVASCULAR: S1, S2 normal. No murmurs, rubs, or gallops.  ABDOMEN: Soft, nontender, nondistended. Bowel sounds present. No organomegaly or mass.  EXTREMITIES: No cyanosis, clubbing or edema b/l.    PSYCHIATRIC: The patient is sedated. SKIN: No obvious rash, lesion, or ulcer.   LABORATORY PANEL:   CBC Recent Labs  Lab 06/26/19 0518  WBC 11.2*  HGB 14.0  HCT 39.9  PLT 78*   ------------------------------------------------------------------------------------------------------------------ Chemistries  Recent Labs  Lab 06/25/19 0608  06/26/19 0521 06/26/19 1155  NA 133*   < > 133*  --   K 4.4   < > 4.5  --   CL 98   < > 101  --   CO2 22   < > 24  --   GLUCOSE 99   < > 108*  --   BUN 66*   < > 39*  --   CREATININE UNABLE TO REPORT DUE TO ICTERUS   < > 1.46*  --   CALCIUM 7.6*   < > 7.6*  --   MG 2.1   < >  --  2.0  AST 317*  --   --   --   ALT UNABLE TO REPORT DUE TO ICTERUS  --    --   --   ALKPHOS 175*  --   --   --   BILITOT 31.1*  --   --   --    < > = values in this interval not displayed.   ------------------------------------------------------------------------------------------------------------------  Cardiac Enzymes No results for input(s): TROPONINI in the last 168 hours. ------------------------------------------------------------------------------------------------------------------  RADIOLOGY:  Dg Chest Port 1 View  Result Date: 06/26/2019 CLINICAL DATA:  Respiratory failure EXAM: PORTABLE CHEST 1 VIEW COMPARISON:  06/25/2019 FINDINGS: Endotracheal tube, nasogastric catheter, right jugular and left jugular central lines are again seen and stable. The overall inspiratory effort is poor with crowding of the vascular markings and previously seen basilar atelectasis. No sizable effusion is noted. IMPRESSION: Tubes and lines stable in appearance. Poor inspiratory effort with crowding of vascular markings and previously seen bibasilar atelectasis. Electronically Signed   By: Inez Catalina M.D.   On: 06/26/2019 08:07   Dg Chest Port 1 View  Result Date: 06/25/2019 CLINICAL DATA:  Elevated liver function tests EXAM: PORTABLE CHEST 1 VIEW COMPARISON:  Yesterday FINDINGS: Endotracheal tube tip at the clavicular heads. Bilateral IJ line with tips at the SVC. The orogastric tube reaches the stomach at least. Low volume chest with interstitial crowding/atelectasis.  Stable heart size. No pneumothorax. IMPRESSION: Stable hardware positioning and low volume chest with basilar opacification. Electronically Signed   By: Monte Fantasia M.D.   On: 06/25/2019 07:44   US Abdomen Limited Ruq  Result Date: 06/24/2019 CLINICAL DATA:  Elevated liver function tests. EXAM: ULTRASOUND ABDOMEN LIMITED RIGHT UPPER QUADRANT COMPARISON:  None. FINDINGS: Gallbladder: Mild diffuse wall thickening with a maximum thickness of 5 mm. No visible gallstones or pericholecystic fluid. No sonographic  Murphy sign. Common bile duct: Diameter: 5.1 mm, poorly visualized. Liver: Markedly diffusely echogenic. No visible mass. The portal vein cannot be adequately visualized to assess flow. Other: Small amount of free peritoneal fluid. IMPRESSION: 1. Markedly diffusely echogenic liver. This could be due to steatosis, cirrhosis or chronic hepatitis. 2. Small amount of ascites. 3. Diffuse gallbladder wall thickening. In the absence of visualized gallstones or a sonographic Murphy sign, this may be due to hypoproteinemia, acute hepatitis or chronic acalculous cholecystitis. Electronically Signed   By: Claudie Revering M.D.   On: 06/24/2019 13:34     ASSESSMENT AND PLAN:   * Upper GI bleed likely variceal bleed Bleeding has stopped at this time. Protonix and octreotide drip stopped.  Now on Protonix IV twice daily GI following patient  *Cirrhosis with liver failure-likely alcohol-related Acute hepatitis panel pending Bilirubin 31. Monitor For prognosis  *Cardiac arrest secondary to hypovolemic shock. Presently intubated.  *Acute kidney injury with hyperkalemia.   On CRRT  Poor prognosis with multiorgan failure at this time.  Palliative care consulted  All the records are reviewed and case discussed with Care Management/Social Worker Management plans discussed with the patient, family and they are in agreement.  CODE STATUS: DNR  DVT Prophylaxis: SCDs  TOTAL TIME TAKING CARE OF THIS PATIENT: 35 minutes.   Leia Alf Josselyn Harkins M.D on 06/26/2019 at 12:43 PM  Between 7am to 6pm - Pager - [phone]  After 6pm go to [email or web address] - password [email or web address] [facility]  Office  [phone]  CC: Primary care physician; Guadalupe Maple, MD  Note: This dictation was prepared with Dragon dictation along with smaller phrase technology. Any transcriptional errors that result from this process are unintentional.

## 2019-06-26 NOTE — Progress Notes (Signed)
Jonathon Bellows , MD [address] [place], [place], [place], [address] [address], [place], [place], [place], [address] Phone: [phone]  Fax: [phone]   Trevor Lopez is being followed for GI bleed   Subjective: On vent appears comfortable    Objective: Vital signs in last 24 hours: Vitals:   06/26/19 0630 06/26/19 0700 06/26/19 0730 06/26/19 0809  BP: 96/67 99/65 100/65 99/62  Pulse: 76 81 88 78  Resp: '13 14 15 12  '$ Temp: 98.1 F (36.7 C) 98.1 F (36.7 C) 98.1 F (36.7 C) 97.9 F (36.6 C)  TempSrc:    Bladder  SpO2: 95% 93% (!) 88% 93%  Weight:      Height:       Weight change: -0.1 kg  Intake/Output Summary (Last 24 hours) at 06/26/2019 1505 Last data filed at 06/26/2019 0809 Gross per 24 hour  Intake 833.05 ml  Output 98 ml  Net 735.05 ml     Exam: Heart:: Regular rate and rhythm, S1S2 present or without murmur or extra heart sounds Lungs: normal, clear to auscultation and clear to auscultation and percussion Abdomen: soft, nontender, normal bowel sounds   Lab Results: '@LABTEST2'$ @ Micro Results: Recent Results (from the past 240 hour(s))  Blood Culture (routine x 2)     Status: None (Preliminary result)   Collection Time: 06/14/2019 12:38 PM   Specimen: BLOOD  Result Value Ref Range Status   Specimen Description BLOOD LEFT ANTECUBITAL  Final   Special Requests   Final    BOTTLES DRAWN AEROBIC AND ANAEROBIC Blood Culture adequate volume   Culture   Final    NO GROWTH 3 DAYS Performed at [facility], [address], [place], [place] [address]    Report Status PENDING  Incomplete  SARS Coronavirus 2 [facility] order, Performed in [place] hospital lab) Nasopharyngeal Nasopharyngeal Swab     Status: None   Collection Time: 07/04/2019 12:58 PM   Specimen: Nasopharyngeal Swab  Result Value Ref Range Status   SARS Coronavirus 2 NEGATIVE NEGATIVE Final    Comment: (NOTE) If result is NEGATIVE SARS-CoV-2 target nucleic acids are NOT  DETECTED. The SARS-CoV-2 RNA is generally detectable in upper and lower  respiratory specimens during the acute phase of infection. The lowest  concentration of SARS-CoV-2 viral copies this assay can detect is 250  copies / mL. A negative result does not preclude SARS-CoV-2 infection  and should not be used as the sole basis for treatment or other  patient management decisions.  A negative result may occur with  improper specimen collection / handling, submission of specimen other  than nasopharyngeal swab, presence of viral mutation(s) within the  areas targeted by this assay, and inadequate number of viral copies  (<250 copies / mL). A negative result must be combined with clinical  observations, patient history, and epidemiological information. If result is POSITIVE SARS-CoV-2 target nucleic acids are DETECTED. The SARS-CoV-2 RNA is generally detectable in upper and lower  respiratory specimens dur ing the acute phase of infection.  Positive  results are indicative of active infection with SARS-CoV-2.  Clinical  correlation with patient history and other diagnostic information is  necessary to determine patient infection status.  Positive results do  not rule out bacterial infection or co-infection with other viruses. If result is PRESUMPTIVE POSTIVE SARS-CoV-2 nucleic acids MAY BE PRESENT.   A presumptive positive result was obtained on the submitted specimen  and confirmed on repeat testing.  While 2019 novel  coronavirus  (SARS-CoV-2) nucleic acids may be present in the submitted sample  additional confirmatory testing may be necessary for epidemiological  and / or clinical management purposes  to differentiate between  SARS-CoV-2 and other Sarbecovirus currently known to infect humans.  If clinically indicated additional testing with an alternate test  methodology [phone]) is advised. The SARS-CoV-2 RNA is generally  detectable in upper and lower respiratory sp ecimens during  the acute  phase of infection. The expected result is Negative. Fact Sheet for Patients:  StrictlyIdeas.no Fact Sheet for Healthcare Providers: BankingDealers.co.za This test is not yet approved or cleared by the [place] FDA and has been authorized for detection and/or diagnosis of SARS-CoV-2 by FDA under an Emergency Use Authorization (EUA).  This EUA will remain in effect (meaning this test can be used) for the duration of the COVID-19 declaration under Section 564(b)(1) of the Act, 21 U.S.C. section 360bbb-3(b)(1), unless the authorization is terminated or revoked sooner. Performed at [facility], [address] [place], [place] [address]   Blood Culture (routine x 2)     Status: None (Preliminary result)   Collection Time: 06/18/2019  6:59 PM   Specimen: BLOOD  Result Value Ref Range Status   Specimen Description BLOOD BLOOD RIGHT HAND  Final   Special Requests   Final    BOTTLES DRAWN AEROBIC AND ANAEROBIC Blood Culture results may not be optimal due to an inadequate volume of blood received in culture bottles   Culture   Final    NO GROWTH 3 DAYS Performed at [facility], [address], [place],  [address]    Report Status PENDING  Incomplete   Studies/Results: Dg Chest Port 1 View  Result Date: 06/26/2019 CLINICAL DATA:  Respiratory failure EXAM: PORTABLE CHEST 1 VIEW COMPARISON:  06/25/2019 FINDINGS: Endotracheal tube, nasogastric catheter, right jugular and left jugular central lines are again seen and stable. The overall inspiratory effort is poor with crowding of the vascular markings and previously seen basilar atelectasis. No sizable effusion is noted. IMPRESSION: Tubes and lines stable in appearance. Poor inspiratory effort with crowding of vascular markings and previously seen bibasilar atelectasis. Electronically Signed   By: Inez Catalina M.D.   On: 06/26/2019 08:07   Dg Chest Port 1  View  Result Date: 06/25/2019 CLINICAL DATA:  Elevated liver function tests EXAM: PORTABLE CHEST 1 VIEW COMPARISON:  Yesterday FINDINGS: Endotracheal tube tip at the clavicular heads. Bilateral IJ line with tips at the SVC. The orogastric tube reaches the stomach at least. Low volume chest with interstitial crowding/atelectasis. Stable heart size. No pneumothorax. IMPRESSION: Stable hardware positioning and low volume chest with basilar opacification. Electronically Signed   By: Monte Fantasia M.D.   On: 06/25/2019 07:44   US Abdomen Limited Ruq  Result Date: 06/24/2019 CLINICAL DATA:  Elevated liver function tests. EXAM: ULTRASOUND ABDOMEN LIMITED RIGHT UPPER QUADRANT COMPARISON:  None. FINDINGS: Gallbladder: Mild diffuse wall thickening with a maximum thickness of 5 mm. No visible gallstones or pericholecystic fluid. No sonographic Murphy sign. Common bile duct: Diameter: 5.1 mm, poorly visualized. Liver: Markedly diffusely echogenic. No visible mass. The portal vein cannot be adequately visualized to assess flow. Other: Small amount of free peritoneal fluid. IMPRESSION: 1. Markedly diffusely echogenic liver. This could be due to steatosis, cirrhosis or chronic hepatitis. 2. Small amount of ascites. 3. Diffuse gallbladder wall thickening. In the absence of visualized gallstones or a sonographic Murphy sign, this may be due to hypoproteinemia, acute hepatitis or chronic  acalculous cholecystitis. Electronically Signed   By: Claudie Revering M.D.   On: 06/24/2019 13:34   Medications: I have reviewed the patient's current medications. Scheduled Meds:  sodium chloride   Intravenous Once   chlorhexidine gluconate (MEDLINE KIT)  15 mL Mouth Rinse BID   Chlorhexidine Gluconate Cloth  6 each Topical Q0600   hydrocortisone sod succinate (SOLU-CORTEF) inj  50 mg Intravenous Q8H   insulin aspart  0-15 Units Subcutaneous Q4H   lactulose  20 g Oral Daily   mouth rinse  15 mL Mouth Rinse 10 times per day    pantoprazole  40 mg Intravenous Q12H   rifaximin  550 mg Oral BID   sodium chloride flush  3 mL Intravenous Q12H   Continuous Infusions:  cefTRIAXone (ROCEPHIN)  IV 1 g (06/25/19 1046)   midazolam 9 mg/hr (06/26/19 0809)   norepinephrine (LEVOPHED) Adult infusion 20 mcg/min (06/26/19 0809)   phytonadione (VITAMIN K) IV Stopped (06/26/19 0134)   pureflow 2,500 mL/hr at 06/26/19 0458   PRN Meds:.albuterol, fentaNYL (SUBLIMAZE) injection, heparin, midazolam, [DISCONTINUED] ondansetron **OR** ondansetron (ZOFRAN) IV   Assessment: Active Problems:   Upper GI bleed   Acute GI bleeding  Alexey E Lopezmartinez 63 y.o. male admitted with an upper GI bleed on 07/10/2019.  Went into cardiac arrest and had to be given CPR.  History of alcohol abuse on admission INR was 2.3 suggesting acute liver failure history of excess NSAID use.  Endoscopy deferred at this point of time as he is very high risk.  Completed 48 hours of octreotide. Right upper quadrant ultrasound shows small amount of ascites and markedly diffuse echogenic liver.  No dilation of the common bile duct.  Hemoglobin 14 g today and INR 1.4.  Total bilirubin 31 with direct bilirubin of 17.  Likely severe acute alcoholic hepatitis.  Plan: 1.  Stop all alcohol and NSAID use. 2.  Monitor CBC and transfuse as needed. 3.  Continue PPI 4.  With acute liver failure and severe alcoholic hepatitis, severe upper GI bleed overall poor prognosis.      LOS: 3 days   Jonathon Bellows, MD 06/26/2019, 9:05 AM

## 2019-06-26 NOTE — Progress Notes (Addendum)
PULMONARY/CCM PROGRESS NOTE  PT PROFILE: 74 M alcoholic with previously undiagnosed cirrhosis admitted via ED with brief out of hospital cardiac arrest after witnessed massive hematemesis.  In ED, profoundly hypotensive and received 6 units RBCs, 4 units FFP, 1 unit platelets on massive transfusion protocol.  He is a heavy drinker and takes large volumes of nonsteroidals.  MAJOR EVENTS/TEST RESULTS: 08/14 admission as documented above 08/14 gastroenterology consultation.  Deemed too unstable for EGD 08/15 RUQ [place]: Markedly diffusely echogenic liver. This could be due to steatosis, cirrhosis or chronic hepatitis. Small amount of ascites. Diffuse gallbladder wall thickening  08/15 nephrology consultation: CRRT initiated 08/15 intermittently agitated.  Low-dose midazolam infusion initiated 08/16 hypothermic, use of Bair hugger  08/16 intermittently agitated, continue use of midazolam infusion   INDWELLING DEVICES:: R femoral cordis 08/14 >> 08/15 ETT 08/14 >>  R IJ CVL 08/14 >>  L IJ HD cath 08/14 >>   MICRO DATA:  SARS-CoV-2 PCR 8/14 >> NEG  Blood 8/14 >> no growth 3 days Resp 8/17 >>  MRSA 8/17> NEG  ANTIMICROBIALS:  Ceftriaxone 08/14 >>   SUBJ: ROS: unable to obtain due to patient intubated and sedated   OBJ:   Vitals:   06/26/19 0830 06/26/19 0900 06/26/19 0930 06/26/19 1000  BP: 92/60 (!) 92/57 (!) 88/53 105/72  Pulse: 75 73 74 75  Resp: 11 12 13 13   Temp: 97.9 F (36.6 C) 97.7 F (36.5 C) 97.7 F (36.5 C) (!) 97.5 F (36.4 C)  TempSrc:      SpO2: 93% 92% 92% 95%  Weight:      Height:       Vent Mode: PCV FiO2 (%):  [50 %-60 %] 60 % Set Rate:  [12 bmp] 12 bmp PEEP:  [5 cmH20] 5 cmH20 Pressure Support:  [16 cmH20] 16 cmH20 Plateau Pressure:  [16 cmH20-18 cmH20] 18 cmH20   Physical Exam  Constitutional: Vital signs are normal. He appears jaundiced. He is intubated.  Intubated and sedated  HENT:  Head: Normocephalic and atraumatic.  Eyes: Scleral  icterus is present. Right pupil is not reactive. Left pupil is not reactive.  Miosis, sluggish pupillary reflex   Neck: No JVD present.  Right IJ central line placed Left triple lumen trialysis catheter placed  Cardiovascular: Normal rate, regular rhythm and normal heart sounds. Exam reveals no gallop and no friction rub.  No murmur heard. Pulmonary/Chest: He is intubated. He has no wheezes. He has no rhonchi. He has no rales.  Anterior chest, clear to ascultation bilaterally 60% FiO2 on ventilator   Abdominal: He exhibits distension. Bowel sounds are hypoactive.  Genitourinary:    Genitourinary Comments: Scrotal swelling   Musculoskeletal:        General: Edema (2+ ankle edema in right, 3+ ankle edeam in left) present.  Neurological:  Sluggish pupillary reflex, no corneal or gag reflex- on sedation  Skin: Skin is warm and dry. Bruising (noted on arms) noted.   CBC Latest Ref Rng & Units 06/26/2019 06/25/2019 06/24/2019  WBC 4.0 - 10.5 K/uL 11.2(H) 11.9(H) 9.3  Hemoglobin 13.0 - 17.0 g/dL 14.0 14.1 13.5  Hematocrit 39.0 - 52.0 % 39.9 38.7(L) 36.3(L)  Platelets 150 - 400 K/uL 78(L) 96(L) 101(L)   BMP Latest Ref Rng & Units 06/26/2019 06/26/2019 06/25/2019  Glucose 70 - 99 mg/dL 108(H) 101(H) 105(H)  BUN 8 - 23 mg/dL 39(H) 42(H) 50(H)  Creatinine 0.61 - 1.24 mg/dL 1.46(H) 1.53(H) 1.96(H)  BUN/Creat Ratio 9 - 20 - - -  Sodium  135 - 145 mmol/L 133(L) 133(L) 132(L)  Potassium 3.5 - 5.1 mmol/L 4.5 4.2 4.4  Chloride 98 - 111 mmol/L 101 93(L) 91(L)  CO2 22 - 32 mmol/L 24 24 23   Calcium 8.9 - 10.3 mg/dL 7.6(L) 7.1(L) 7.5(L)   PT/INR: 17.1/1.4  CXR: Low lung volume, bibasilar opacities, suspect atelectasis, increased vascular markings   IMPRESSION: 1) Admitted with massive UGIB, hemorrhagic shock, brief out of hosp cardiac arrest 2) Ventilator dependent respiratory failure. Intubated due to AMS after brief cardiac arrest 3) Oliguric AKI- receiving CRRT 4) Metabolic acidosis - resolved on  CRRT 5) Hyperkalemia, resolved - resolved on CRRT 6) Mild hyponatremia, resolved 7) UGIB - variceal vs gastric (heavy alcohol abuse, heavy NSAIDs use)             Appears to be resolved 8) Acute liver failure 9) Likely underlying alcoholic cirrhosis 7) Suspect ascites 8) Acute blood loss anemia - not overtly bleeding at present time 9) Moderate thrombocytopenia 10) Coagulopathy due to liver failure 11) Acute encephalopathy, multifactorial 12) history of heavy alcohol abuse prior to admission 13) ICU/ventilator associated discomfort  PLAN/REC: Cont vent support - settings reviewed and/or adjusted Cont vent bundle Daily SBT if/when meets criteria ICU hemodynamic monitoring Wean norepinephrine to off for MAP >65 mmHg DC vasopressin 08/16 Cont hydrocortisone initiated8/15. Dose decreased 08/16 Monitor BMET intermittently Monitor I/Os Correct electrolytes as indicated Cont  CRRT - initiated 8/15 Cont IV pantoprazole DC octreotide infusion 08/16 Cont to hold on TF's for now Moderate scale SSI ordered (while on systemic steroids) Monitor temp, WBC count Micro and abx as above DVT px: SCDs Monitor CBC intermittently Transfuse per usual guidelines RASS goal -1,-2 Cont PAD protocol initiated 08/15 - midaz infusion, PRN fentanyl  Bair hugger PRN for hypothermia  Respiratory cultures-pending  Procalcitonin-pending  Palliative care consulted   I updated patient's wife and daughter in detail.  I explained that he remains very critically ill with little evidence of improvement.  We began to discuss endpoints of our current high level of critical care.  I suggested that we not push this much past the end of this week unless he demonstrates clear evidence of improvement.  We have requested palliative care service's input.  CCM time: 35 mins The above time includes time spent in consultation with patient and/or family members and reviewing care plan on multidisciplinary rounds  Merton Border, MD PCCM service Mobile [phone] Pager [phone] 06/26/2019 12:58 PM

## 2019-06-26 NOTE — Progress Notes (Signed)
CRRT restarted

## 2019-06-26 NOTE — Progress Notes (Signed)
CRRT paused for cartridge change. Line packed with heparin.

## 2019-06-26 NOTE — Progress Notes (Signed)
[facility]  ROUNDING NOTE   Subjective:   On norepinephrine gtt  CRRT. No UF.   Objective:  Vital signs in last 24 hours:  Temp:  [97.5 F (36.4 C)-98.6 F (37 C)] 97.7 F (36.5 C) (08/17 0900) Pulse Rate:  [70-88] 73 (08/17 0900) Resp:  [10-17] 12 (08/17 0900) BP: (81-103)/(52-68) 92/57 (08/17 0900) SpO2:  [88 %-96 %] 92 % (08/17 0900) FiO2 (%):  [50 %-60 %] 60 % (08/17 0809) Weight:  [90 kg] 90 kg (08/17 0237)  Weight change: -0.1 kg Filed Weights   06/24/19 0500 06/25/19 0420 06/26/19 0237  Weight: 87.3 kg 90.1 kg 90 kg    Intake/Output: I/O last 3 completed shifts: In: 1379.4 [I.V.:1139.4; NG/GT:90; IV Piggyback:150] Out: 135 [Urine:135]   Intake/Output this shift:  Total I/O In: 164.7 [I.V.:59.1; IV Piggyback:105.6] Out: 13 [Urine:13]  Physical Exam: General: NAD,   Head: Normocephalic, atraumatic. Moist oral mucosal membranes  Eyes: Anicteric, PERRL  Neck: Supple, trachea midline  Lungs:  Pressure control FiO2   Heart: Regular rate and rhythm  Abdomen:  Soft, nontender,   Extremities:  no peripheral edema.  Neurologic: Nonfocal, moving all four extremities  Skin: No lesions  Access: Left IJ temp dialysis 8/33    Basic Metabolic Panel: Recent Labs  Lab 06/25/19 0327 06/25/19 0608 06/25/19 1401 06/25/19 1753 06/26/19 0002 06/26/19 0518 06/26/19 0521  NA 132* 133* 132* 132* 133*  --  133*  K 4.4 4.4 4.4 4.4 4.2  --  4.5  CL 98 98 95* 91* 93*  --  101  CO2 _0 --  24  GLUCOSE 97 99 108* 105* 101*  --  108*  BUN 73* 66* 54* 50* 42*  --  39*  CREATININE 3.35* UNABLE TO REPORT DUE TO ICTERUS 2.24* 1.96* 1.53*  --  1.46*  CALCIUM 7.4* 7.6* 7.5* 7.5* 7.1*  --  7.6*  MG 2.0 2.1 1.9 1.9 1.9 2.0  --   PHOS 4.9*  --  4.2 4.0 3.6  --  3.1    Liver Function Tests: Recent Labs  Lab 06/16/2019 1321 06/16/2019 1715 07/03/2019 2140 06/24/19 0500  06/25/19 0608 06/25/19 1401 06/25/19 1753 06/26/19 0002 06/26/19 0521  AST  160* 258* 233* 257*  --  317*  --   --   --   --   ALT 50* 77* UNABLE TO REPORT DUE TO ICTERIC INTERFERENCE NOT VALID  --  UNABLE TO REPORT DUE TO ICTERUS  --   --   --   --   ALKPHOS 228* 218* 163* 164*  --  175*  --   --   --   --   BILITOT 25.9* 29.1* 27.4* 31.0*  --  31.1*  --   --   --   --   PROT 4.9* 6.1* 5.5* 5.4*  --  5.7*  --   --   --   --   ALBUMIN 1.6* 2.3* 2.2* 2.2*   < > 2.1* 2.2* 2.2* 1.9* 2.0*   < > = values in this interval not displayed.   No results for input(s): LIPASE, AMYLASE in the last 168 hours. Recent Labs  Lab 06/15/2019 1715  AMMONIA 51*    CBC: Recent Labs  Lab 06/25/2019 1321 06/29/2019 1700 06/24/19 0145 06/24/19 0500 06/25/19 0608 06/26/19 0518  WBC 17.0* 10.6* 8.4 9.3 11.9* 11.2*  NEUTROABS 10.7*  --   --   --   --   --  HGB 7.1* 15.1 13.7 13.5 14.1 14.0  HCT 20.5* 43.5 36.8* 36.3* 38.7* 39.9  MCV 116.5* 93.8 88.0 86.6 91.1 93.4  PLT 239 128* 113* 101* 96* 78*    Cardiac Enzymes: No results for input(s): CKTOTAL, CKMB, CKMBINDEX, TROPONINI in the last 168 hours.  BNP: Invalid input(s): POCBNP  CBG: Recent Labs  Lab 06/25/19 1618 06/25/19 1955 06/25/19 2359 06/26/19 0432 06/26/19 0737  GLUCAP 99 98 108* 91 100*    Microbiology: Results for orders placed or performed during the hospital encounter of 06/14/2019  Blood Culture (routine x 2)     Status: None (Preliminary result)   Collection Time: 07/02/2019 12:38 PM   Specimen: BLOOD  Result Value Ref Range Status   Specimen Description BLOOD LEFT ANTECUBITAL  Final   Special Requests   Final    BOTTLES DRAWN AEROBIC AND ANAEROBIC Blood Culture adequate volume   Culture   Final    NO GROWTH 3 DAYS Performed at [facility], [address] [place], [place] [address]    Report Status PENDING  Incomplete  SARS Coronavirus 2 [facility] order, Performed in [facility] hospital lab) Nasopharyngeal Nasopharyngeal Swab     Status: None   Collection Time: 07/06/2019 12:58 PM    Specimen: Nasopharyngeal Swab  Result Value Ref Range Status   SARS Coronavirus 2 NEGATIVE NEGATIVE Final    Comment: (NOTE) If result is NEGATIVE SARS-CoV-2 target nucleic acids are NOT DETECTED. The SARS-CoV-2 RNA is generally detectable in upper and lower  respiratory specimens during the acute phase of infection. The lowest  concentration of SARS-CoV-2 viral copies this assay can detect is 250  copies / mL. A negative result does not preclude SARS-CoV-2 infection  and should not be used as the sole basis for treatment or other  patient management decisions.  A negative result may occur with  improper specimen collection / handling, submission of specimen other  than nasopharyngeal swab, presence of viral mutation(s) within the  areas targeted by this assay, and inadequate number of viral copies  (<250 copies / mL). A negative result must be combined with clinical  observations, patient history, and epidemiological information. If result is POSITIVE SARS-CoV-2 target nucleic acids are DETECTED. The SARS-CoV-2 RNA is generally detectable in upper and lower  respiratory specimens dur ing the acute phase of infection.  Positive  results are indicative of active infection with SARS-CoV-2.  Clinical  correlation with patient history and other diagnostic information is  necessary to determine patient infection status.  Positive results do  not rule out bacterial infection or co-infection with other viruses. If result is PRESUMPTIVE POSTIVE SARS-CoV-2 nucleic acids MAY BE PRESENT.   A presumptive positive result was obtained on the submitted specimen  and confirmed on repeat testing.  While 2019 novel coronavirus  (SARS-CoV-2) nucleic acids may be present in the submitted sample  additional confirmatory testing may be necessary for epidemiological  and / or clinical management purposes  to differentiate between  SARS-CoV-2 and other Sarbecovirus currently known to infect humans.  If  clinically indicated additional testing with an alternate test  methodology [phone]) is advised. The SARS-CoV-2 RNA is generally  detectable in upper and lower respiratory sp ecimens during the acute  phase of infection. The expected result is Negative. Fact Sheet for Patients:  StrictlyIdeas.no Fact Sheet for Healthcare Providers: BankingDealers.co.za This test is not yet approved or cleared by the [place] FDA and has been authorized for detection and/or diagnosis of SARS-CoV-2 by FDA  under an Emergency Use Authorization (EUA).  This EUA will remain in effect (meaning this test can be used) for the duration of the COVID-19 declaration under Section 564(b)(1) of the Act, 21 U.S.C. section 360bbb-3(b)(1), unless the authorization is terminated or revoked sooner. Performed at [facility], [address] [place], [place] [address]   Blood Culture (routine x 2)     Status: None (Preliminary result)   Collection Time: 07/01/2019  6:59 PM   Specimen: BLOOD  Result Value Ref Range Status   Specimen Description BLOOD BLOOD RIGHT HAND  Final   Special Requests   Final    BOTTLES DRAWN AEROBIC AND ANAEROBIC Blood Culture results may not be optimal due to an inadequate volume of blood received in culture bottles   Culture   Final    NO GROWTH 3 DAYS Performed at [facility], [address] [place], [place] [address]    Report Status PENDING  Incomplete    Coagulation Studies: Recent Labs    06/24/2019 1720 07/10/2019 2140 06/24/19 0500 06/25/19 0608 06/26/19 0518  LABPROT 19.0* 18.8* 17.5* 17.8* 17.1*  INR 1.6* 1.6* 1.5* 1.5* 1.4*    Urinalysis: Recent Labs    06/24/19 0500  COLORURINE RED*  LABSPEC 1.020  PHURINE TEST NOT REPORTED DUE TO COLOR INTERFERENCE OF URINE PIGMENT  GLUCOSEU TEST NOT REPORTED DUE TO COLOR INTERFERENCE OF URINE PIGMENT*  HGBUR TEST NOT REPORTED DUE TO COLOR INTERFERENCE OF URINE  PIGMENT*  BILIRUBINUR TEST NOT REPORTED DUE TO COLOR INTERFERENCE OF URINE PIGMENT*  KETONESUR TEST NOT REPORTED DUE TO COLOR INTERFERENCE OF URINE PIGMENT*  PROTEINUR TEST NOT REPORTED DUE TO COLOR INTERFERENCE OF URINE PIGMENT*  NITRITE TEST NOT REPORTED DUE TO COLOR INTERFERENCE OF URINE PIGMENT*  LEUKOCYTESUR TEST NOT REPORTED DUE TO COLOR INTERFERENCE OF URINE PIGMENT*      Imaging: Dg Chest Port 1 View  Result Date: 06/26/2019 CLINICAL DATA:  Respiratory failure EXAM: PORTABLE CHEST 1 VIEW COMPARISON:  06/25/2019 FINDINGS: Endotracheal tube, nasogastric catheter, right jugular and left jugular central lines are again seen and stable. The overall inspiratory effort is poor with crowding of the vascular markings and previously seen basilar atelectasis. No sizable effusion is noted. IMPRESSION: Tubes and lines stable in appearance. Poor inspiratory effort with crowding of vascular markings and previously seen bibasilar atelectasis. Electronically Signed   By: Inez Catalina M.D.   On: 06/26/2019 08:07   Dg Chest Port 1 View  Result Date: 06/25/2019 CLINICAL DATA:  Elevated liver function tests EXAM: PORTABLE CHEST 1 VIEW COMPARISON:  Yesterday FINDINGS: Endotracheal tube tip at the clavicular heads. Bilateral IJ line with tips at the SVC. The orogastric tube reaches the stomach at least. Low volume chest with interstitial crowding/atelectasis. Stable heart size. No pneumothorax. IMPRESSION: Stable hardware positioning and low volume chest with basilar opacification. Electronically Signed   By: Monte Fantasia M.D.   On: 06/25/2019 07:44   US Abdomen Limited Ruq  Result Date: 06/24/2019 CLINICAL DATA:  Elevated liver function tests. EXAM: ULTRASOUND ABDOMEN LIMITED RIGHT UPPER QUADRANT COMPARISON:  None. FINDINGS: Gallbladder: Mild diffuse wall thickening with a maximum thickness of 5 mm. No visible gallstones or pericholecystic fluid. No sonographic Murphy sign. Common bile duct: Diameter: 5.1  mm, poorly visualized. Liver: Markedly diffusely echogenic. No visible mass. The portal vein cannot be adequately visualized to assess flow. Other: Small amount of free peritoneal fluid. IMPRESSION: 1. Markedly diffusely echogenic liver. This could be due to steatosis, cirrhosis or chronic hepatitis. 2.  Small amount of ascites. 3. Diffuse gallbladder wall thickening. In the absence of visualized gallstones or a sonographic Murphy sign, this may be due to hypoproteinemia, acute hepatitis or chronic acalculous cholecystitis. Electronically Signed   By: Claudie Revering M.D.   On: 06/24/2019 13:34     Medications:   . sodium chloride 250 mL (06/26/19 0922)  . cefTRIAXone (ROCEPHIN)  IV 1 g (06/26/19 0926)  . midazolam 9 mg/hr (06/26/19 0809)  . norepinephrine (LEVOPHED) Adult infusion 20 mcg/min (06/26/19 0809)  . phytonadione (VITAMIN K) IV Stopped (06/26/19 0134)  . pureflow 2,500 mL/hr at 06/26/19 0458  . vasopressin (PITRESSIN) infusion - *FOR SHOCK*     . sodium chloride   Intravenous Once  . chlorhexidine gluconate (MEDLINE KIT)  15 mL Mouth Rinse BID  . Chlorhexidine Gluconate Cloth  6 each Topical Q0600  . hydrocortisone sod succinate (SOLU-CORTEF) inj  50 mg Intravenous Q8H  . insulin aspart  0-15 Units Subcutaneous Q4H  . lactulose  20 g Per Tube BID  . mouth rinse  15 mL Mouth Rinse 10 times per day  . pantoprazole  40 mg Intravenous Q12H  . rifaximin  550 mg Per Tube BID  . sodium chloride flush  3 mL Intravenous Q12H   sodium chloride, albuterol, fentaNYL (SUBLIMAZE) injection, heparin, midazolam, [DISCONTINUED] ondansetron **OR** ondansetron (ZOFRAN) IV  Assessment/ Plan:  Mr. Trevor Lopez is a 63 y.o. white male with alcohol abuse, psoriasis, tubular adenoma of the colon,tobacco abusewho was [facility]2020for evaluation of severe GI bleeding and cardiac arrest. Started CRRT on 8/15 for acute renal failure  1. Severe acute renal failure with metabolic acidosis  secondary to hypovolemic shock. Baseline creatinine of 0.95, normal GFR on 04/14/2016.  Anuric urine output. Requiring renal replacement therapy - Continue CRRT. Will try to start ultrafiltration today.   2. Hypovolemic shock/hypotension due to GI bleed. Requiring vasopressors: norepinephrine.   3. Acute respiratory failure: requiring mechanical ventilation   LOS: 3 Cassandria Drew 8/17/20209:41 AM

## 2019-06-27 ENCOUNTER — Inpatient Hospital Stay: Payer: 59

## 2019-06-27 DIAGNOSIS — Z7189 Other specified counseling: Secondary | ICD-10-CM

## 2019-06-27 DIAGNOSIS — R945 Abnormal results of liver function studies: Secondary | ICD-10-CM

## 2019-06-27 LAB — RENAL FUNCTION PANEL
Albumin: 1.8 g/dL — ABNORMAL LOW (ref 3.5–5.0)
Albumin: 1.9 g/dL — ABNORMAL LOW (ref 3.5–5.0)
Albumin: 2 g/dL — ABNORMAL LOW (ref 3.5–5.0)
Albumin: 2 g/dL — ABNORMAL LOW (ref 3.5–5.0)
Anion gap: 10 (ref 5–15)
Anion gap: 10 (ref 5–15)
Anion gap: 11 (ref 5–15)
Anion gap: 13 (ref 5–15)
BUN: 30 mg/dL — ABNORMAL HIGH (ref 8–23)
BUN: 31 mg/dL — ABNORMAL HIGH (ref 8–23)
BUN: 31 mg/dL — ABNORMAL HIGH (ref 8–23)
BUN: 33 mg/dL — ABNORMAL HIGH (ref 8–23)
CO2: 17 mmol/L — ABNORMAL LOW (ref 22–32)
CO2: 22 mmol/L (ref 22–32)
CO2: 23 mmol/L (ref 22–32)
CO2: 23 mmol/L (ref 22–32)
Calcium: 7.4 mg/dL — ABNORMAL LOW (ref 8.9–10.3)
Calcium: 7.6 mg/dL — ABNORMAL LOW (ref 8.9–10.3)
Calcium: 7.8 mg/dL — ABNORMAL LOW (ref 8.9–10.3)
Calcium: 7.9 mg/dL — ABNORMAL LOW (ref 8.9–10.3)
Chloride: 101 mmol/L (ref 98–111)
Chloride: 102 mmol/L (ref 98–111)
Chloride: 103 mmol/L (ref 98–111)
Chloride: 103 mmol/L (ref 98–111)
Creatinine, Ser: 0.79 mg/dL (ref 0.61–1.24)
Creatinine, Ser: 1 mg/dL (ref 0.61–1.24)
Creatinine, Ser: 1.13 mg/dL (ref 0.61–1.24)
Creatinine, Ser: UNDETERMINED mg/dL (ref 0.61–1.24)
GFR calc Af Amer: >60 mL/min
GFR calc Af Amer: >60 mL/min (ref >60)
GFR calc Af Amer: >60 mL/min (ref >60)
GFR calc non Af Amer: >60 mL/min
GFR calc non Af Amer: >60 mL/min (ref >60)
GFR calc non Af Amer: >60 mL/min (ref >60)
Glucose, Bld: 104 mg/dL — ABNORMAL HIGH (ref 70–99)
Glucose, Bld: 106 mg/dL — ABNORMAL HIGH (ref 70–99)
Glucose, Bld: 109 mg/dL — ABNORMAL HIGH (ref 70–99)
Glucose, Bld: 113 mg/dL — ABNORMAL HIGH (ref 70–99)
Phosphorus: 2.9 mg/dL (ref 2.5–4.6)
Phosphorus: 3.3 mg/dL (ref 2.5–4.6)
Phosphorus: 3.6 mg/dL (ref 2.5–4.6)
Phosphorus: UNDETERMINED mg/dL (ref 2.5–4.6)
Potassium: 4.5 mmol/L (ref 3.5–5.1)
Potassium: 4.6 mmol/L (ref 3.5–5.1)
Potassium: 4.7 mmol/L (ref 3.5–5.1)
Potassium: 5.4 mmol/L — ABNORMAL HIGH (ref 3.5–5.1)
Sodium: 133 mmol/L — ABNORMAL LOW (ref 135–145)
Sodium: 134 mmol/L — ABNORMAL LOW (ref 135–145)
Sodium: 135 mmol/L (ref 135–145)
Sodium: 136 mmol/L (ref 135–145)

## 2019-06-27 LAB — BASIC METABOLIC PANEL WITH GFR
Anion gap: 8 (ref 5–15)
BUN: 31 mg/dL — ABNORMAL HIGH (ref 8–23)
CO2: 25 mmol/L (ref 22–32)
Calcium: 7.7 mg/dL — ABNORMAL LOW (ref 8.9–10.3)
Chloride: 102 mmol/L (ref 98–111)
Creatinine, Ser: UNDETERMINED mg/dL (ref 0.61–1.24)
Glucose, Bld: 109 mg/dL — ABNORMAL HIGH (ref 70–99)
Potassium: 4.6 mmol/L (ref 3.5–5.1)
Sodium: 135 mmol/L (ref 135–145)

## 2019-06-27 LAB — HEPATIC FUNCTION PANEL
ALT: UNDETERMINED U/L (ref 0–44)
AST: 268 U/L — ABNORMAL HIGH (ref 15–41)
Albumin: 1.9 g/dL — ABNORMAL LOW (ref 3.5–5.0)
Alkaline Phosphatase: 207 U/L — ABNORMAL HIGH (ref 38–126)
Bilirubin, Direct: 16.4 mg/dL — ABNORMAL HIGH (ref 0.0–0.2)
Indirect Bilirubin: 13.5 mg/dL — ABNORMAL HIGH (ref 0.3–0.9)
Total Bilirubin: 29.9 mg/dL (ref 0.3–1.2)
Total Protein: 5.7 g/dL — ABNORMAL LOW (ref 6.5–8.1)

## 2019-06-27 LAB — GLUCOSE, CAPILLARY
Glucose-Capillary: 103 mg/dL — ABNORMAL HIGH (ref 70–99)
Glucose-Capillary: 106 mg/dL — ABNORMAL HIGH (ref 70–99)
Glucose-Capillary: 112 mg/dL — ABNORMAL HIGH (ref 70–99)
Glucose-Capillary: 92 mg/dL (ref 70–99)
Glucose-Capillary: 93 mg/dL (ref 70–99)
Glucose-Capillary: 99 mg/dL (ref 70–99)
Glucose-Capillary: 99 mg/dL (ref 70–99)

## 2019-06-27 LAB — CBC
HCT: 40.1 % (ref 39.0–52.0)
Hemoglobin: 14 g/dL (ref 13.0–17.0)
MCH: 32.9 pg (ref 26.0–34.0)
MCHC: 34.9 g/dL (ref 30.0–36.0)
MCV: 94.4 fL (ref 80.0–100.0)
Platelets: 62 10*3/uL — ABNORMAL LOW (ref 150–400)
RBC: 4.25 MIL/uL (ref 4.22–5.81)
RDW: 22.7 % — ABNORMAL HIGH (ref 11.5–15.5)
WBC: 12.7 10*3/uL — ABNORMAL HIGH (ref 4.0–10.5)
nRBC: 0.8 % — ABNORMAL HIGH (ref 0.0–0.2)

## 2019-06-27 LAB — MAGNESIUM
Magnesium: 1.9 mg/dL (ref 1.7–2.4)
Magnesium: 1.9 mg/dL (ref 1.7–2.4)
Magnesium: 2 mg/dL (ref 1.7–2.4)
Magnesium: 2.4 mg/dL (ref 1.7–2.4)

## 2019-06-27 LAB — PROTIME-INR
INR: 1.3 — ABNORMAL HIGH (ref 0.8–1.2)
Prothrombin Time: 16.4 s — ABNORMAL HIGH (ref 11.4–15.2)

## 2019-06-27 MED ORDER — PRO-STAT SUGAR FREE PO LIQD
30.0000 mL | Freq: Two times a day (BID) | ORAL | Status: DC
  Administered 2019-06-27 – 2019-06-28 (×2): 30 mL

## 2019-06-27 MED ORDER — VITAL HIGH PROTEIN PO LIQD
1000.0000 mL | ORAL | Status: DC
  Administered 2019-06-27 – 2019-06-28 (×2): 1000 mL

## 2019-06-27 MED ORDER — FENTANYL CITRATE (PF) 100 MCG/2ML IJ SOLN
50.0000 ug | INTRAMUSCULAR | Status: DC | PRN
  Administered 2019-06-28: 04:00:00 50 ug via INTRAVENOUS
  Filled 2019-06-27: qty 2

## 2019-06-27 NOTE — Progress Notes (Signed)
[place] [facility] [place] NAME: Trevor Lopez    MR#:  [id number]  DATE OF BIRTH:  [birth year]  SUBJECTIVE:  CHIEF COMPLAINT:   Chief Complaint  Patient presents with  . GI Bleeding   Sedated on vent On CRRT  No further bleeding noticed, patient critically ill .  On 2 vasopressors.  Hemoglobin stable.  REVIEW OF SYSTEMS:    Review of Systems  Unable to perform ROS: Intubated   DRUG ALLERGIES:  No Known Allergies  VITALS:  Blood pressure (!) 85/60, pulse 70, temperature 98.2 F (36.8 C), temperature source Bladder, resp. rate 12, height 5\' 4"  (1.626 m), weight 88.6 kg, SpO2 95 %.  PHYSICAL EXAMINATION:   Physical Exam  GENERAL:  63 y.o.-year-old patient lying in the bed. Sedated. ETT.  Jaundiced EYES: Pupils equal, round, reactive to light  +scleral icterus. Extraocular muscles intact.  HEENT: Head atraumatic, normocephalic. Oropharynx and nasopharynx clear.  NECK:  Supple, no jugular venous distention. No thyroid enlargement, no tenderness.  LUNGS: Normal breath sounds bilaterally.  CARDIOVASCULAR: S1, S2 normal. No murmurs, rubs, or gallops.  ABDOMEN: Soft, nontender, nondistended. Bowel sounds present. No organomegaly or mass.  EXTREMITIES: No cyanosis, clubbing or edema b/l.    PSYCHIATRIC: The patient is sedated. SKIN: No obvious rash, lesion, or ulcer.   LABORATORY PANEL:   CBC Recent Labs  Lab 06/27/19 0547  WBC 12.7*  HGB 14.0  HCT 40.1  PLT 62*   ------------------------------------------------------------------------------------------------------------------ Chemistries  Recent Labs  Lab 06/27/19 0547 06/27/19 1158  NA 136  135 133*  K 4.6  4.6 5.4*  CL 103  102 103  CO2 23  25 17*  GLUCOSE 106*  109* 104*  BUN 31*  31* 30*  CREATININE 1.00  UNABLE TO REPORT DUE TO ICTERUS UNABLE TO REPORT DUE TO ICTERUS  CALCIUM 7.8*  7.7* 7.4*  MG 1.9  --   AST 268*  --   ALT UNABLE TO REPORT DUE TO ICTERUS  --    ALKPHOS 207*  --   BILITOT 29.9*  --    ------------------------------------------------------------------------------------------------------------------  Cardiac Enzymes No results for input(s): TROPONINI in the last 168 hours. ------------------------------------------------------------------------------------------------------------------  RADIOLOGY:  Dg Chest Port 1 View  Result Date: 06/27/2019 CLINICAL DATA:  Respiratory failure EXAM: PORTABLE CHEST 1 VIEW COMPARISON:  06/26/2019 FINDINGS: Cardiac shadow is stable. Endotracheal tube and nasogastric catheter are noted and stable. Bilateral jugular catheters are again seen and stable. The overall inspiratory effort is again poor with crowding of the parenchymal markings. Previously seen bibasilar atelectatic changes are again noted. No new focal abnormality is noted. IMPRESSION: Stable bibasilar atelectasis. Tubes and lines as described. Electronically Signed   By: Inez Catalina M.D.   On: 06/27/2019 07:06   Dg Chest Port 1 View  Result Date: 06/26/2019 CLINICAL DATA:  Respiratory failure EXAM: PORTABLE CHEST 1 VIEW COMPARISON:  06/25/2019 FINDINGS: Endotracheal tube, nasogastric catheter, right jugular and left jugular central lines are again seen and stable. The overall inspiratory effort is poor with crowding of the vascular markings and previously seen basilar atelectasis. No sizable effusion is noted. IMPRESSION: Tubes and lines stable in appearance. Poor inspiratory effort with crowding of vascular markings and previously seen bibasilar atelectasis. Electronically Signed   By: Inez Catalina M.D.   On: 06/26/2019 08:07     ASSESSMENT AND PLAN:   * Upper GI bleed likely variceal bleed Bleeding has stopped at this time. Protonix and octreotide drip stopped.  Now  on Protonix IV twice daily GI following patient Hemoglobin stable.  Continue PPIs.  GI on case.  Patient has multiorgan failure with acute liver failure, renal failure,  severe alcoholic hepatitis with upper GI bleed, respiratory failure, patient prognosis poor, now DNR.  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 Monitor For prognosis  *Cardiac arrest secondary to hypovolemic shock. Presently intubated.  Continue pressors. *Acute kidney injury with hyperkalemia.   On CRRT  Poor prognosis with multiorgan failure at this time.  Palliative care consulted  All the records are reviewed and case discussed with Care Management/Social Worker Management plans discussed with the patient, family and they are in agreement.  CODE STATUS: DNR  DVT Prophylaxis: SCDs  TOTAL TIME TAKING CARE OF THIS PATIENT: 35 minutes.   Epifanio Lesches M.D on 06/27/2019 at 1:38 PM  Between 7am to 6pm - Pager - [phone]  After 6pm go to [email or web address] - password [email or web address] [place] [facility]  Office  [phone]  CC: Primary care physician; Guadalupe Maple, MD  Note: This dictation was prepared with Dragon dictation along with smaller phrase technology. Any transcriptional errors that result from this process are unintentional.

## 2019-06-27 NOTE — Progress Notes (Signed)
Asked to speak with family regarding visitation policy. The wife who requires assistance with mobility was dependent on a family member to assist. Up until yesterday, the designated family member was identified as the daughter.   Today, the wife was attended to by her sister. I introduced myself and discussed the need to discuss the visitation policy. Two family members are permitted given the needs of the wife. The support person should be the same person at every visit. It was explained the need to minimize risk to the patient in this room and other patients on the unit.   The wife described to me that COVID 19 is a hoax. I indicated to the wife that she certainly had the right to the opinion and belief that the virus is not real.  I reiterated the responsibilities of leadership and all staff  were charged with the task to maintain the guidelines associated with COVID 19.   After some discussion, we came to the agreement that the sister who was present today could act as the support person, however that would prevent the daughter from being the designated support person.  The wife was not happy with the restrictions and asked for the administrators name, to which I provided. The wife and sister appeared to understand the conversation, the restrictions and the change in the identified support person.

## 2019-06-27 NOTE — Progress Notes (Signed)
PULMONARY/CCM PROGRESS NOTE  PT PROFILE: 62 M alcoholic with previously undiagnosed cirrhosis admitted via ED with brief out of hospital cardiac arrest after witnessed massive hematemesis.  In ED, profoundly hypotensive and received 6 units RBCs, 4 units FFP, 1 unit platelets on massive transfusion protocol.  He is a heavy drinker and takes large volumes of nonsteroidals.  MAJOR EVENTS/TEST RESULTS: 08/14 admission as documented above 08/14 gastroenterology consultation.  Deemed too unstable for EGD 08/15 RUQ US: Markedly diffusely echogenic liver. This could be due to steatosis, cirrhosis or chronic hepatitis. Small amount of ascites. Diffuse gallbladder wall thickening  08/15 nephrology consultation: CRRT initiated 08/15 intermittently agitated.  Low-dose midazolam infusion initiated 08/16 - 08/18 remains on midaz 2-4 mg/hr for ventilator synchrony.  Remains on vasopressors and CRRT.  Remains intubated, FiO2 60%.  Not following commands.  Withdraws from pain.  Family updated on daily basis and poor prognosis conveyed with little evidence of improvement.  INDWELLING DEVICES:: R femoral cordis 08/14 >> 08/15 ETT 08/14 >>  R IJ CVL 08/14 >>  L IJ HD cath 08/14 >>   MICRO DATA:  SARS-CoV-2 PCR 8/14 >> NEG  Blood 8/14 >> NEG Resp 08/17 >>   ANTIMICROBIALS:  Ceftriaxone 08/14 >>   PCT 08/17: 3.09,   SUBJ: RASS -4 on midaz infusion. Not F/C.  Synchronous with ventilator  OBJ: Vitals:   06/27/19 1230 06/27/19 1300 06/27/19 1330 06/27/19 1400  BP: (!) 89/61 (!) 85/59 90/60 97/65  Pulse: 71 70 70 72  Resp: 12 12 12 12  Temp: 98.2 F (36.8 C) 98.2 F (36.8 C) 98.1 F (36.7 C) 98.1 F (36.7 C)  TempSrc:      SpO2: 94% 94% 95% 95%  Weight:      Height:       Vent Mode: PCV FiO2 (%):  [60 %] 60 % Set Rate:  [12 bmp] 12 bmp PEEP:  [5 cmH20] 5 cmH20 Plateau Pressure:  [18 cmH20-19 cmH20] 18 cmH20   Gen: intubated, RASS -4, not following commands, severe icterus HEENT: Severe  sclericterus, NCAT Neck: No LAN, no JVD noted Lungs: No wheezes anteriorly Cardiovascular: Regular, no M Abdomen: Soft, NT, + BS Ext: Warm, symmetric ankle and pedal edema Neuro: PERRL, EOMI, withdraws all 4 extremities Skin: Severe jaundice, extensive ecchymoses on BUE   BMP Latest Ref Rng & Units 06/27/2019 06/27/2019 06/27/2019  Glucose 70 - 99 mg/dL 104(H) 106(H) 109(H)  BUN 8 - 23 mg/dL 30(H) 31(H) 31(H)  Creatinine 0.61 - 1.24 mg/dL UNABLE TO REPORT DUE TO ICTERUS 1.00 UNABLE TO REPORT DUE TO ICTERUS  BUN/Creat Ratio 9 - 20 - - -  Sodium 135 - 145 mmol/L 133(L) 136 135  Potassium 3.5 - 5.1 mmol/L 5.4(H) 4.6 4.6  Chloride 98 - 111 mmol/L 103 103 102  CO2 22 - 32 mmol/L 17(L) 23 25  Calcium 8.9 - 10.3 mg/dL 7.4(L) 7.8(L) 7.7(L)    Hepatic Function Latest Ref Rng & Units 06/27/2019 06/27/2019 06/27/2019  Total Protein 6.5 - 8.1 g/dL - - 5.7(L)  Albumin 3.5 - 5.0 g/dL 1.9(L) 1.8(L) 1.9(L)  AST 15 - 41 U/L - - 268(H)  ALT 0 - 44 U/L - - UNABLE TO REPORT DUE TO ICTERUS  Alk Phosphatase 38 - 126 U/L - - 207(H)  Total Bilirubin 0.3 - 1.2 mg/dL - - 29.9(HH)  Bilirubin, Direct 0.0 - 0.2 mg/dL - - 16.4(H)    CBC Latest Ref Rng & Units 06/27/2019 06/26/2019 06/25/2019  WBC 4.0 - 10.5 K/uL 12.7(H)   11.2(H) 11.9(H)  Hemoglobin 13.0 - 17.0 g/dL 14.0 14.0 14.1  Hematocrit 39.0 - 52.0 % 40.1 39.9 38.7(L)  Platelets 150 - 400 K/uL 62(L) 78(L) 96(L)      ABG    Component Value Date/Time   PHART 7.47 (H) 06/24/2019 0451   PCO2ART 31 (L) 06/24/2019 0451   PO2ART 81 (L) 06/24/2019 0451   HCO3 22.6 06/24/2019 0451   ACIDBASEDEF 0.2 06/24/2019 0451   O2SAT 96.6 06/24/2019 0451     CXR: No significant change low lung volumes, bibasilar opacities, suspect atelectasis   IMPRESSION: 1) Admitted with massive UGIB, hemorrhagic shock, brief out of hosp cardiac arrest 2) Ventilator dependent respiratory failure. Intubated due to AMS after brief cardiac arrest 3) vasopressor dependent shock -  back on vasopressin 4) anuric AKI  5) Metabolic acidosis  6) Hyperkalemia, resolved - resolved on CRRT 7) Mild hyponatremia 8) UGIB - variceal vs gastric (heavy alcohol abuse, heavy NSAIDs use)  Appears to be resolved 9) Acute liver failure due to alcoholic hepatitis 10) underlying cirrhosis, likely alcoholic 11) Acute blood loss anemia - not overtly bleeding at present time 12) moderate thrombocytopenia 13) Coagulopathy due to liver failure 14) Acute encephalopathy, multifactorial - metabolic, alcohol withdrawal 15) history of heavy alcohol abuse prior to admission 16) ICU/ventilator associated discomfort  PLAN/REC: Cont vent support - settings reviewed and/or adjusted Cont vent bundle Daily SBT if/when meets criteria Continue ICU hemodynamic monitoring Wean norepinephrine to off for MAP >65 mmHg Cont hydrocortisone initiated8/15. Dose decreased 08/16 Monitor BMET intermittently Monitor I/Os Correct electrolytes as indicated Cont  CRRT - initiated 8/15 Cont IV pantoprazole Octreotide infusion discontinued 08/16 Initiate TF protocol 8/18 Continue moderate scale SSI ordered (while on systemic steroids) Monitor temp, WBC count Micro and abx as above DVT px: SCDs Monitor CBC intermittently Transfuse per usual guidelines RASS goal -1,-2 Cont PAD protocol initiated 08/15 - midaz infusion, PRN fentanyl  I again updated the patient's wife in detail.  I again indicated that he has a poor prognosis for functional survival.  We again discussed limitations of care including how long we continue our aggressive support.  All of her questions were answered.  Her sister was present during this discussion   CCM time: 35 mins The above time includes time spent in consultation with patient and/or family members and reviewing care plan on multidisciplinary rounds  David Simonds, MD PCCM service Mobile [phone] Pager [phone] 06/27/2019 3:49 PM  

## 2019-06-27 NOTE — Progress Notes (Signed)
[facility]  ROUNDING NOTE   Subjective:   On norepinephrine gtt and vasopressin gtt.   CRRT UF 94m.  UOP 647m  Objective:  Vital signs in last 24 hours:  Temp:  [97 F (36.1 C)-99 F (37.2 C)] 98.6 F (37 C) (08/18 1000) Pulse Rate:  [68-77] 70 (08/18 1000) Resp:  [12-15] 12 (08/18 1000) BP: (79-95)/(49-68) 83/57 (08/18 1000) SpO2:  [85 %-95 %] 92 % (08/18 1000) FiO2 (%):  [60 %] 60 % (08/18 1107) Weight:  [88.6 kg] 88.6 kg (08/18 0500)  Weight change: -1.4 kg Filed Weights   06/25/19 0420 06/26/19 0237 06/27/19 0500  Weight: 90.1 kg 90 kg 88.6 kg    Intake/Output: I/O last 3 completed shifts: In: 1494.1 [I.V.:1063.5; NG/GT:175; IV Piggyback:255.6] Out: 103546Urine:87; Other:966]   Intake/Output this shift:  Total I/O In: 32.5 [I.V.:32.5] Out: 58 [Urine:10; Other:48]  Physical Exam: General: Critically ill  Head: ETT  Eyes: +icterus  Neck: Supple, trachea midline  Lungs:   Pressure Control. FIO2 60%  Heart: Regular rate and rhythm  Abdomen:  Soft, nontender,   Extremities:  no peripheral edema.  Neurologic: Intubated and sedated  Skin: +jaundice  Access: Left IJ temp dialysis 06/14/67  Basic Metabolic Panel: Recent Labs  Lab 06/26/19 0518 06/26/19 0521 06/26/19 1155 06/26/19 1156 06/26/19 1739 06/27/19 0001 06/27/19 0547  NA  --  133*  --  134* 132* 134* 136  135  K  --  4.5  --  4.6 4.7 4.5 4.6  4.6  CL  --  101  --  101 102 101 103  102  CO2  --  24  --  20* 21* _0 GLUCOSE  --  108*  --  111* 108* 109* 106*  109*  BUN  --  39*  --  36* 34* 33* 31*  31*  CREATININE  --  1.46*  --  1.37* 1.18 1.13 1.00  UNABLE TO REPORT DUE TO ICTERUS  CALCIUM  --  7.6*  --  7.3* 7.4* 7.6* 7.8*  7.7*  MG 2.0  --  2.0  --  2.0 1.9 1.9  PHOS  --  3.1  --  3.3 2.6 3.3 2.9    Liver Function Tests: Recent Labs  Lab 07/09/2019 1715 06/26/2019 2140 06/24/19 0500  06/25/19 0608  06/26/19 0521 06/26/19 1156 06/26/19 1739  06/27/19 0001 06/27/19 0547  AST 258* 233* 257*  --  317*  --   --   --   --   --  268*  ALT 77* UNABLE TO REPORT DUE TO ICTERIC INTERFERENCE NOT VALID  --  UNABLE TO REPORT DUE TO ICTERUS  --   --   --   --   --  UNABLE TO REPORT DUE TO ICTERUS  ALKPHOS 218* 163* 164*  --  175*  --   --   --   --   --  207*  BILITOT 29.1* 27.4* 31.0*  --  31.1*  --   --   --   --   --  29.9*  PROT 6.1* 5.5* 5.4*  --  5.7*  --   --   --   --   --  5.7*  ALBUMIN 2.3* 2.2* 2.2*   < > 2.1*   < > 2.0* 1.8* 1.8* 2.0* 1.8*  1.9*   < > = values in this interval not displayed.   No results for input(s): LIPASE, AMYLASE in the  last 168 hours. Recent Labs  Lab 06/17/2019 1715  AMMONIA 51*    CBC: Recent Labs  Lab 06/16/2019 1321  06/24/19 0145 06/24/19 0500 06/25/19 0608 06/26/19 0518 06/27/19 0547  WBC 17.0*   < > 8.4 9.3 11.9* 11.2* 12.7*  NEUTROABS 10.7*  --   --   --   --   --   --   HGB 7.1*   < > 13.7 13.5 14.1 14.0 14.0  HCT 20.5*   < > 36.8* 36.3* 38.7* 39.9 40.1  MCV 116.5*   < > 88.0 86.6 91.1 93.4 94.4  PLT 239   < > 113* 101* 96* 78* 62*   < > = values in this interval not displayed.    Cardiac Enzymes: No results for input(s): CKTOTAL, CKMB, CKMBINDEX, TROPONINI in the last 168 hours.  BNP: Invalid input(s): POCBNP  CBG: Recent Labs  Lab 06/26/19 1119 06/26/19 1631 06/26/19 2034 06/27/19 0016 06/27/19 0717  GLUCAP 104* 94 99 99 99    Microbiology: Results for orders placed or performed during the hospital encounter of 07/07/2019  Blood Culture (routine x 2)     Status: None (Preliminary result)   Collection Time: 06/28/2019 12:38 PM   Specimen: BLOOD  Result Value Ref Range Status   Specimen Description BLOOD LEFT ANTECUBITAL  Final   Special Requests   Final    BOTTLES DRAWN AEROBIC AND ANAEROBIC Blood Culture adequate volume   Culture   Final    NO GROWTH 4 DAYS Performed at [facility], [address], [place], [place] [address]    Report Status PENDING   Incomplete  SARS Coronavirus 2 [facility] order, Performed in [place] hospital lab) Nasopharyngeal Nasopharyngeal Swab     Status: None   Collection Time: 07/02/2019 12:58 PM   Specimen: Nasopharyngeal Swab  Result Value Ref Range Status   SARS Coronavirus 2 NEGATIVE NEGATIVE Final    Comment: (NOTE) If result is NEGATIVE SARS-CoV-2 target nucleic acids are NOT DETECTED. The SARS-CoV-2 RNA is generally detectable in upper and lower  respiratory specimens during the acute phase of infection. The lowest  concentration of SARS-CoV-2 viral copies this assay can detect is 250  copies / mL. A negative result does not preclude SARS-CoV-2 infection  and should not be used as the sole basis for treatment or other  patient management decisions.  A negative result may occur with  improper specimen collection / handling, submission of specimen other  than nasopharyngeal swab, presence of viral mutation(s) within the  areas targeted by this assay, and inadequate number of viral copies  (<250 copies / mL). A negative result must be combined with clinical  observations, patient history, and epidemiological information. If result is POSITIVE SARS-CoV-2 target nucleic acids are DETECTED. The SARS-CoV-2 RNA is generally detectable in upper and lower  respiratory specimens dur ing the acute phase of infection.  Positive  results are indicative of active infection with SARS-CoV-2.  Clinical  correlation with patient history and other diagnostic information is  necessary to determine patient infection status.  Positive results do  not rule out bacterial infection or co-infection with other viruses. If result is PRESUMPTIVE POSTIVE SARS-CoV-2 nucleic acids MAY BE PRESENT.   A presumptive positive result was obtained on the submitted specimen  and confirmed on repeat testing.  While 2019 novel coronavirus  (SARS-CoV-2) nucleic acids may be present in the submitted sample  additional confirmatory testing  may be necessary for epidemiological  and / or clinical  management purposes  to differentiate between  SARS-CoV-2 and other Sarbecovirus currently known to infect humans.  If clinically indicated additional testing with an alternate test  methodology [phone]) is advised. The SARS-CoV-2 RNA is generally  detectable in upper and lower respiratory sp ecimens during the acute  phase of infection. The expected result is Negative. Fact Sheet for Patients:  StrictlyIdeas.no Fact Sheet for Healthcare Providers: BankingDealers.co.za This test is not yet approved or cleared by the [place] FDA and has been authorized for detection and/or diagnosis of SARS-CoV-2 by FDA under an Emergency Use Authorization (EUA).  This EUA will remain in effect (meaning this test can be used) for the duration of the COVID-19 declaration under Section 564(b)(1) of the Act, 21 U.S.C. section 360bbb-3(b)(1), unless the authorization is terminated or revoked sooner. Performed at [facility], [address] [place], [place] [address]   Blood Culture (routine x 2)     Status: None (Preliminary result)   Collection Time: 06/28/2019  6:59 PM   Specimen: BLOOD  Result Value Ref Range Status   Specimen Description BLOOD BLOOD RIGHT HAND  Final   Special Requests   Final    BOTTLES DRAWN AEROBIC AND ANAEROBIC Blood Culture results may not be optimal due to an inadequate volume of blood received in culture bottles   Culture   Final    NO GROWTH 4 DAYS Performed at [facility], [address], [place], [place] [address]    Report Status PENDING  Incomplete  MRSA PCR Screening     Status: None   Collection Time: 06/26/19  9:13 AM   Specimen: Nasal Mucosa; Nasopharyngeal  Result Value Ref Range Status   MRSA by PCR NEGATIVE NEGATIVE Final    Comment:        The GeneXpert MRSA Assay (FDA approved for NASAL specimens only), is one component of  a comprehensive MRSA colonization surveillance program. It is not intended to diagnose MRSA infection nor to guide or monitor treatment for MRSA infections. Performed at [facility], [address] [place], [place] [address]   Culture, respiratory     Status: None (Preliminary result)   Collection Time: 06/26/19 11:52 AM   Specimen: Tracheal Aspirate; Respiratory  Result Value Ref Range Status   Specimen Description   Final    TRACHEAL ASPIRATE Performed at [facility], [address] [place], [place] [address]    Special Requests   Final    NONE Performed at [facility], [address] [place], [place] [address]    Gram Stain   Final    ABUNDANT WBC PRESENT,BOTH PMN AND MONONUCLEAR ABUNDANT GRAM NEGATIVE RODS MODERATE GRAM POSITIVE COCCI ABUNDANT YEAST    Culture   Final    TOO YOUNG TO READ Performed at [facility], [place] [address], [place], [place] [address]    Report Status PENDING  Incomplete    Coagulation Studies: Recent Labs    06/25/19 0608 06/26/19 0518 06/27/19 0547  LABPROT 17.8* 17.1* 16.4*  INR 1.5* 1.4* 1.3*    Urinalysis: No results for input(s): COLORURINE, LABSPEC, PHURINE, GLUCOSEU, HGBUR, BILIRUBINUR, KETONESUR, PROTEINUR, UROBILINOGEN, NITRITE, LEUKOCYTESUR in the last 72 hours.  Invalid input(s): APPERANCEUR    Imaging: Dg Chest Port 1 View  Result Date: 06/27/2019 CLINICAL DATA:  Respiratory failure EXAM: PORTABLE CHEST 1 VIEW COMPARISON:  06/26/2019 FINDINGS: Cardiac shadow is stable. Endotracheal tube and nasogastric catheter are noted and stable. Bilateral jugular catheters are again seen and  stable. The overall inspiratory effort is again poor with crowding of the parenchymal markings. Previously seen bibasilar atelectatic changes are again noted. No new focal abnormality is noted. IMPRESSION: Stable bibasilar atelectasis. Tubes and lines as described. Electronically Signed   By: Inez Catalina  M.D.   On: 06/27/2019 07:06   Dg Chest Port 1 View  Result Date: 06/26/2019 CLINICAL DATA:  Respiratory failure EXAM: PORTABLE CHEST 1 VIEW COMPARISON:  06/25/2019 FINDINGS: Endotracheal tube, nasogastric catheter, right jugular and left jugular central lines are again seen and stable. The overall inspiratory effort is poor with crowding of the vascular markings and previously seen basilar atelectasis. No sizable effusion is noted. IMPRESSION: Tubes and lines stable in appearance. Poor inspiratory effort with crowding of vascular markings and previously seen bibasilar atelectasis. Electronically Signed   By: Inez Catalina M.D.   On: 06/26/2019 08:07     Medications:   . sodium chloride 5 mL/hr at 06/27/19 0758  . cefTRIAXone (ROCEPHIN)  IV 1 g (06/27/19 1104)  . midazolam Stopped (06/27/19 0752)  . norepinephrine (LEVOPHED) Adult infusion 12 mcg/min (06/27/19 0758)  . pureflow 3 each (06/27/19 0435)  . vasopressin (PITRESSIN) infusion - *FOR SHOCK* 0.03 Units/min (06/27/19 0918)   . sodium chloride   Intravenous Once  . chlorhexidine gluconate (MEDLINE KIT)  15 mL Mouth Rinse BID  . Chlorhexidine Gluconate Cloth  6 each Topical Q0600  . hydrocortisone sod succinate (SOLU-CORTEF) inj  50 mg Intravenous Q8H  . insulin aspart  0-15 Units Subcutaneous Q4H  . lactulose  20 g Per Tube BID  . mouth rinse  15 mL Mouth Rinse 10 times per day  . pantoprazole  40 mg Intravenous Q12H  . rifaximin  550 mg Per Tube BID  . sodium chloride flush  10-40 mL Intracatheter Q12H  . sodium chloride flush  3 mL Intravenous Q12H   sodium chloride, albuterol, fentaNYL (SUBLIMAZE) injection, heparin, midazolam, [DISCONTINUED] ondansetron **OR** ondansetron (ZOFRAN) IV, sodium chloride flush  Assessment/ Plan:  Mr. GERHARDT GLEED is a 63 y.o. white male with alcohol abuse, psoriasis, tubular adenoma of the colon,tobacco abusewho was [facility] evaluation of severe GI bleeding and cardiac  arrest. Started CRRT on 8/15 for acute renal failure  1. Severe acute renal failure with metabolic acidosis secondary to hypovolemic shock. Baseline creatinine of 0.95, normal GFR on 04/14/2016.  Anuric urine output. Requiring renal replacement therapy - Continue CRRT. Continue ultrafiltration as tolerated.   2. Hypovolemic shock/hypotension due to GI bleed. Requiring vasopressors: norepinephrine and vasopressin.   3. Acute respiratory failure: requiring mechanical ventilation  4. Multiorgan failure with elevated MELD score. Overall prognosis is poor.    LOS: 4 Ella Golomb 8/18/202011:26 AM

## 2019-06-27 NOTE — Progress Notes (Signed)
Trevor Lopez , MD [address] [place], [place], [place], [address] [address], [place], [place], [place], [address] Phone: [phone]  Fax: [phone]   Trevor Lopez is being followed for GI bleed, alcoholic hepatitis  Subjective: Intubated and ventilated   Objective: Vital signs in last 24 hours: Vitals:   06/27/19 1030 06/27/19 1100 06/27/19 1130 06/27/19 1200  BP: (!) 89/55 (!) 77/52 90/63 (!) 85/60  Pulse: 60 73 73 70  Resp: '12 10 13 12  '$ Temp: 98.6 F (37 C) 98.6 F (37 C) 98.4 F (36.9 C) 98.2 F (36.8 C)  TempSrc:    Bladder  SpO2: 92% 92% 95% 95%  Weight:      Height:       Weight change: -1.4 kg  Intake/Output Summary (Last 24 hours) at 06/27/2019 1239 Last data filed at 06/27/2019 0800 Gross per 24 hour  Intake 942.91 ml  Output 1016 ml  Net -73.09 ml     Exam: Heart:: Regular rate and rhythm, S1S2 present or without murmur or extra heart sounds Lungs: normal, clear to auscultation and clear to auscultation and percussion Abdomen: soft, nontender, normal bowel sounds   Lab Results: '@LABTEST2'$ @ Micro Results: Recent Results (from the past 240 hour(s))  Blood Culture (routine x 2)     Status: None (Preliminary result)   Collection Time: 07/05/2019 12:38 PM   Specimen: BLOOD  Result Value Ref Range Status   Specimen Description BLOOD LEFT ANTECUBITAL  Final   Special Requests   Final    BOTTLES DRAWN AEROBIC AND ANAEROBIC Blood Culture adequate volume   Culture   Final    NO GROWTH 4 DAYS Performed at [facility], [address], [place], [place] [address]    Report Status PENDING  Incomplete  SARS Coronavirus 2 [facility] order, Performed in [place] hospital lab) Nasopharyngeal Nasopharyngeal Swab     Status: None   Collection Time: 06/22/2019 12:58 PM   Specimen: Nasopharyngeal Swab  Result Value Ref Range Status   SARS Coronavirus 2 NEGATIVE NEGATIVE Final    Comment: (NOTE) If result is NEGATIVE SARS-CoV-2 target  nucleic acids are NOT DETECTED. The SARS-CoV-2 RNA is generally detectable in upper and lower  respiratory specimens during the acute phase of infection. The lowest  concentration of SARS-CoV-2 viral copies this assay can detect is 250  copies / mL. A negative result does not preclude SARS-CoV-2 infection  and should not be used as the sole basis for treatment or other  patient management decisions.  A negative result may occur with  improper specimen collection / handling, submission of specimen other  than nasopharyngeal swab, presence of viral mutation(s) within the  areas targeted by this assay, and inadequate number of viral copies  (<250 copies / mL). A negative result must be combined with clinical  observations, patient history, and epidemiological information. If result is POSITIVE SARS-CoV-2 target nucleic acids are DETECTED. The SARS-CoV-2 RNA is generally detectable in upper and lower  respiratory specimens dur ing the acute phase of infection.  Positive  results are indicative of active infection with SARS-CoV-2.  Clinical  correlation with patient history and other diagnostic information is  necessary to determine patient infection status.  Positive results do  not rule out bacterial infection or co-infection with other viruses. If result is PRESUMPTIVE POSTIVE SARS-CoV-2 nucleic acids MAY BE PRESENT.   A presumptive positive result was obtained on the submitted specimen  and confirmed on repeat testing.  While 2019  novel coronavirus  (SARS-CoV-2) nucleic acids may be present in the submitted sample  additional confirmatory testing may be necessary for epidemiological  and / or clinical management purposes  to differentiate between  SARS-CoV-2 and other Sarbecovirus currently known to infect humans.  If clinically indicated additional testing with an alternate test  methodology [phone]) is advised. The SARS-CoV-2 RNA is generally  detectable in upper and lower  respiratory sp ecimens during the acute  phase of infection. The expected result is Negative. Fact Sheet for Patients:  StrictlyIdeas.no Fact Sheet for Healthcare Providers: BankingDealers.co.za This test is not yet approved or cleared by the [place] FDA and has been authorized for detection and/or diagnosis of SARS-CoV-2 by FDA under an Emergency Use Authorization (EUA).  This EUA will remain in effect (meaning this test can be used) for the duration of the COVID-19 declaration under Section 564(b)(1) of the Act, 21 U.S.C. section 360bbb-3(b)(1), unless the authorization is terminated or revoked sooner. Performed at [facility], [address], [place], [place] [address]   Blood Culture (routine x 2)     Status: None (Preliminary result)   Collection Time: 07/04/2019  6:59 PM   Specimen: BLOOD  Result Value Ref Range Status   Specimen Description BLOOD BLOOD RIGHT HAND  Final   Special Requests   Final    BOTTLES DRAWN AEROBIC AND ANAEROBIC Blood Culture results may not be optimal due to an inadequate volume of blood received in culture bottles   Culture   Final    NO GROWTH 4 DAYS Performed at [facility], [address], [place], [place] [address]    Report Status PENDING  Incomplete  MRSA PCR Screening     Status: None   Collection Time: 06/26/19  9:13 AM   Specimen: Nasal Mucosa; Nasopharyngeal  Result Value Ref Range Status   MRSA by PCR NEGATIVE NEGATIVE Final    Comment:        The GeneXpert MRSA Assay (FDA approved for NASAL specimens only), is one component of a comprehensive MRSA colonization surveillance program. It is not intended to diagnose MRSA infection nor to guide or monitor treatment for MRSA infections. Performed at [facility], [address] [place], [place] [address]   Culture, respiratory     Status: None (Preliminary result)   Collection Time: 06/26/19 11:52 AM    Specimen: Tracheal Aspirate; Respiratory  Result Value Ref Range Status   Specimen Description   Final    TRACHEAL ASPIRATE Performed at [facility], [address] [place], [place] [address]    Special Requests   Final    NONE Performed at [facility], [address] [place], [place] [address]    Gram Stain   Final    ABUNDANT WBC PRESENT,BOTH PMN AND MONONUCLEAR ABUNDANT GRAM NEGATIVE RODS MODERATE GRAM POSITIVE COCCI ABUNDANT YEAST    Culture   Final    TOO YOUNG TO READ Performed at [facility], [place] [address] [place], [place] [address]    Report Status PENDING  Incomplete   Studies/Results: Dg Chest Port 1 View  Result Date: 06/27/2019 CLINICAL DATA:  Respiratory failure EXAM: PORTABLE CHEST 1 VIEW COMPARISON:  06/26/2019 FINDINGS: Cardiac shadow is stable. Endotracheal tube and nasogastric catheter are noted and stable. Bilateral jugular catheters are again seen and stable. The overall inspiratory effort is again poor with crowding of the parenchymal markings. Previously seen bibasilar atelectatic changes are again noted. No new focal abnormality is  noted. IMPRESSION: Stable bibasilar atelectasis. Tubes and lines as described. Electronically Signed   By: Inez Catalina M.D.   On: 06/27/2019 07:06   Dg Chest Port 1 View  Result Date: 06/26/2019 CLINICAL DATA:  Respiratory failure EXAM: PORTABLE CHEST 1 VIEW COMPARISON:  06/25/2019 FINDINGS: Endotracheal tube, nasogastric catheter, right jugular and left jugular central lines are again seen and stable. The overall inspiratory effort is poor with crowding of the vascular markings and previously seen basilar atelectasis. No sizable effusion is noted. IMPRESSION: Tubes and lines stable in appearance. Poor inspiratory effort with crowding of vascular markings and previously seen bibasilar atelectasis. Electronically Signed   By: Inez Catalina M.D.   On: 06/26/2019 08:07   Medications: I have reviewed the  patient's current medications. Scheduled Meds: . sodium chloride   Intravenous Once  . chlorhexidine gluconate (MEDLINE KIT)  15 mL Mouth Rinse BID  . Chlorhexidine Gluconate Cloth  6 each Topical Q0600  . hydrocortisone sod succinate (SOLU-CORTEF) inj  50 mg Intravenous Q8H  . insulin aspart  0-15 Units Subcutaneous Q4H  . lactulose  20 g Per Tube BID  . mouth rinse  15 mL Mouth Rinse 10 times per day  . pantoprazole  40 mg Intravenous Q12H  . rifaximin  550 mg Per Tube BID  . sodium chloride flush  10-40 mL Intracatheter Q12H  . sodium chloride flush  3 mL Intravenous Q12H   Continuous Infusions: . sodium chloride 5 mL/hr at 06/27/19 0758  . cefTRIAXone (ROCEPHIN)  IV 1 g (06/27/19 1104)  . midazolam Stopped (06/27/19 0752)  . norepinephrine (LEVOPHED) Adult infusion 12 mcg/min (06/27/19 0758)  . pureflow 3 each (06/27/19 0435)  . vasopressin (PITRESSIN) infusion - *FOR SHOCK* 0.03 Units/min (06/27/19 0918)   PRN Meds:.sodium chloride, albuterol, fentaNYL (SUBLIMAZE) injection, heparin, midazolam, [DISCONTINUED] ondansetron **OR** ondansetron (ZOFRAN) IV, sodium chloride flush   Assessment: Active Problems:   Upper GI bleed   Acute GI bleeding  Devon E Vondra 63 y.o. male admitted with an upper GI bleed on 06/13/2019.  Went into cardiac arrest and had to be given CPR.  History of alcohol abuse on admission INR was 2.3 suggesting acute liver failure history of excess NSAID use.  Endoscopy deferred at this point of time as he is very high risk.  Completed 48 hours of octreotide. Right upper quadrant ultrasound shows small amount of ascites and markedly diffuse echogenic liver.  No dilation of the common bile duct.  Hemoglobin 14 g today and INR 1.4.  Total bilirubin 31 with direct bilirubin of 17.  Likely severe acute alcoholic hepatitis. Hb stable. DF 36( short termn mortality 25-45% at one month)- cannot use steroids in setting of GI bleed. MELD score of 23- 19.6 % 3 month mortality  this is not taking into account his renal failure, cardiac arrests , need for inotropic support ongoing presently . I have spoken with family and explained the poor prognosis.   Plan: 1.  Stop all alcohol and NSAID use. 2.  Monitor CBC and transfuse as needed. 3.  Continue PPI 4.  With acute liver failure, renal failure, cardiac failure  ,severe alcoholic hepatitis, severe upper GI bleed possible overall poor prognosis.Would recommend involving palliative care to discuss goals of care       LOS: 4 days   Trevor Bellows, MD 06/27/2019, 12:39 PM

## 2019-06-27 NOTE — Progress Notes (Signed)
CRRT Medication Management:   Pharmacy consulted for CRRT medication adjustments for 63 yo male with respiratory failure, hypovolemic shock secondary to GI Bleed, metabolic acidosis. Patient receiving 4K bath. Patient started ultrafiltration yesterday. No medication adjustments warranted at this time.   Pharmacy will continue to monitor and adjust per consult.   Kodey Xue Dear Nicholes Mango 06/27/2019 4:10PM

## 2019-06-28 ENCOUNTER — Inpatient Hospital Stay: Payer: 59

## 2019-06-28 DIAGNOSIS — Z66 Do not resuscitate: Secondary | ICD-10-CM

## 2019-06-28 DIAGNOSIS — R402434 Glasgow coma scale score 3-8, 24 hours or more after hospital admission: Secondary | ICD-10-CM

## 2019-06-28 DIAGNOSIS — Z515 Encounter for palliative care: Secondary | ICD-10-CM

## 2019-06-28 LAB — RENAL FUNCTION PANEL
Albumin: 1.8 g/dL — ABNORMAL LOW (ref 3.5–5.0)
Albumin: 1.8 g/dL — ABNORMAL LOW (ref 3.5–5.0)
Albumin: 1.9 g/dL — ABNORMAL LOW (ref 3.5–5.0)
Albumin: 1.9 g/dL — ABNORMAL LOW (ref 3.5–5.0)
Anion gap: 11 (ref 5–15)
Anion gap: 7 (ref 5–15)
Anion gap: 8 (ref 5–15)
Anion gap: 9 (ref 5–15)
BUN: 29 mg/dL — ABNORMAL HIGH (ref 8–23)
BUN: 30 mg/dL — ABNORMAL HIGH (ref 8–23)
BUN: 32 mg/dL — ABNORMAL HIGH (ref 8–23)
BUN: 34 mg/dL — ABNORMAL HIGH (ref 8–23)
CO2: 23 mmol/L (ref 22–32)
CO2: 24 mmol/L (ref 22–32)
CO2: 24 mmol/L (ref 22–32)
CO2: 24 mmol/L (ref 22–32)
Calcium: 7.6 mg/dL — ABNORMAL LOW (ref 8.9–10.3)
Calcium: 7.8 mg/dL — ABNORMAL LOW (ref 8.9–10.3)
Calcium: 7.9 mg/dL — ABNORMAL LOW (ref 8.9–10.3)
Calcium: 8 mg/dL — ABNORMAL LOW (ref 8.9–10.3)
Chloride: 101 mmol/L (ref 98–111)
Chloride: 103 mmol/L (ref 98–111)
Chloride: 104 mmol/L (ref 98–111)
Chloride: 104 mmol/L (ref 98–111)
Creatinine, Ser: 0.83 mg/dL (ref 0.61–1.24)
Creatinine, Ser: 0.84 mg/dL (ref 0.61–1.24)
Creatinine, Ser: 0.94 mg/dL (ref 0.61–1.24)
Creatinine, Ser: 1.05 mg/dL (ref 0.61–1.24)
GFR calc Af Amer: >60 mL/min (ref >60)
GFR calc Af Amer: >60 mL/min (ref >60)
GFR calc Af Amer: >60 mL/min (ref >60)
GFR calc Af Amer: >60 mL/min (ref >60)
GFR calc non Af Amer: >60 mL/min (ref >60)
GFR calc non Af Amer: >60 mL/min (ref >60)
GFR calc non Af Amer: >60 mL/min (ref >60)
GFR calc non Af Amer: >60 mL/min (ref >60)
Glucose, Bld: 123 mg/dL — ABNORMAL HIGH (ref 70–99)
Glucose, Bld: 124 mg/dL — ABNORMAL HIGH (ref 70–99)
Glucose, Bld: 146 mg/dL — ABNORMAL HIGH (ref 70–99)
Glucose, Bld: 155 mg/dL — ABNORMAL HIGH (ref 70–99)
Phosphorus: 2.9 mg/dL (ref 2.5–4.6)
Phosphorus: 3.2 mg/dL (ref 2.5–4.6)
Phosphorus: 3.2 mg/dL (ref 2.5–4.6)
Phosphorus: 3.5 mg/dL (ref 2.5–4.6)
Potassium: 4.3 mmol/L (ref 3.5–5.1)
Potassium: 4.4 mmol/L (ref 3.5–5.1)
Potassium: 4.6 mmol/L (ref 3.5–5.1)
Potassium: 4.6 mmol/L (ref 3.5–5.1)
Sodium: 135 mmol/L (ref 135–145)
Sodium: 135 mmol/L (ref 135–145)
Sodium: 136 mmol/L (ref 135–145)
Sodium: 136 mmol/L (ref 135–145)

## 2019-06-28 LAB — HEPATIC FUNCTION PANEL
ALT: UNDETERMINED U/L (ref 0–44)
AST: 246 U/L — ABNORMAL HIGH (ref 15–41)
Albumin: 1.9 g/dL — ABNORMAL LOW (ref 3.5–5.0)
Alkaline Phosphatase: 189 U/L — ABNORMAL HIGH (ref 38–126)
Bilirubin, Direct: 18.3 mg/dL — ABNORMAL HIGH (ref 0.0–0.2)
Indirect Bilirubin: 11.2 mg/dL — ABNORMAL HIGH (ref 0.3–0.9)
Total Bilirubin: 29.5 mg/dL (ref 0.3–1.2)
Total Protein: 5.9 g/dL — ABNORMAL LOW (ref 6.5–8.1)

## 2019-06-28 LAB — PROTIME-INR
INR: 1.4 — ABNORMAL HIGH (ref 0.8–1.2)
Prothrombin Time: 16.7 seconds — ABNORMAL HIGH (ref 11.4–15.2)

## 2019-06-28 LAB — CULTURE, BLOOD (ROUTINE X 2)
Culture: NO GROWTH
Culture: NO GROWTH
Special Requests: ADEQUATE

## 2019-06-28 LAB — MAGNESIUM
Magnesium: 1.9 mg/dL (ref 1.7–2.4)
Magnesium: 2 mg/dL (ref 1.7–2.4)
Magnesium: 2 mg/dL (ref 1.7–2.4)
Magnesium: 2 mg/dL (ref 1.7–2.4)
Magnesium: 2.1 mg/dL (ref 1.7–2.4)

## 2019-06-28 LAB — GLUCOSE, CAPILLARY
Glucose-Capillary: 108 mg/dL — ABNORMAL HIGH (ref 70–99)
Glucose-Capillary: 112 mg/dL — ABNORMAL HIGH (ref 70–99)
Glucose-Capillary: 114 mg/dL — ABNORMAL HIGH (ref 70–99)
Glucose-Capillary: 125 mg/dL — ABNORMAL HIGH (ref 70–99)
Glucose-Capillary: 125 mg/dL — ABNORMAL HIGH (ref 70–99)
Glucose-Capillary: 131 mg/dL — ABNORMAL HIGH (ref 70–99)

## 2019-06-28 LAB — CBC
HCT: 41.4 % (ref 39.0–52.0)
Hemoglobin: 14.4 g/dL (ref 13.0–17.0)
MCH: 33.1 pg (ref 26.0–34.0)
MCHC: 34.8 g/dL (ref 30.0–36.0)
MCV: 95.2 fL (ref 80.0–100.0)
Platelets: 56 10*3/uL — ABNORMAL LOW (ref 150–400)
RBC: 4.35 MIL/uL (ref 4.22–5.81)
RDW: 23 % — ABNORMAL HIGH (ref 11.5–15.5)
WBC: 17.3 10*3/uL — ABNORMAL HIGH (ref 4.0–10.5)
nRBC: 0.5 % — ABNORMAL HIGH (ref 0.0–0.2)

## 2019-06-28 LAB — PHOSPHORUS: Phosphorus: UNDETERMINED mg/dL (ref 2.5–4.6)

## 2019-06-28 MED ORDER — FENTANYL CITRATE (PF) 100 MCG/2ML IJ SOLN: 50.0000 ug | INTRAMUSCULAR | Status: DC | PRN

## 2019-06-28 MED ORDER — VITAL HIGH PROTEIN PO LIQD: 1000.0000 mL | ORAL | Status: DC

## 2019-06-28 MED ORDER — MORPHINE 100MG IN NS 100ML (1MG/ML) PREMIX INFUSION
1.0000 mg/h | INTRAVENOUS | Status: DC
  Administered 2019-06-28 – 2019-06-29 (×3): 5 mg/h via INTRAVENOUS
  Filled 2019-06-28 (×2): qty 100

## 2019-06-28 MED ORDER — HYDROCORTISONE NA SUCCINATE PF 100 MG IJ SOLR
50.0000 mg | Freq: Two times a day (BID) | INTRAMUSCULAR | Status: DC
  Administered 2019-06-28 – 2019-06-29 (×2): 50 mg via INTRAVENOUS
  Filled 2019-06-28 (×2): qty 2

## 2019-06-28 MED ORDER — MIDAZOLAM HCL 2 MG/2ML IJ SOLN: 2.0000 mg | INTRAMUSCULAR | Status: DC | PRN

## 2019-06-28 MED ORDER — VITAL HIGH PROTEIN PO LIQD
1000.0000 mL | ORAL | Status: DC
  Administered 2019-06-28: 1000 mL

## 2019-06-28 MED ORDER — PRO-STAT SUGAR FREE PO LIQD
30.0000 mL | Freq: Every day | ORAL | Status: DC
  Administered 2019-06-29: 30 mL

## 2019-06-28 MED ORDER — B COMPLEX-C PO TABS
1.0000 | ORAL_TABLET | Freq: Every day | ORAL | Status: DC
  Administered 2019-06-28: 23:00:00 1
  Filled 2019-06-28 (×2): qty 1

## 2019-06-28 NOTE — Progress Notes (Signed)
PULMONARY/CCM PROGRESS NOTE  PT PROFILE: 22 M alcoholic with previously undiagnosed cirrhosis admitted via ED with brief out of hospital cardiac arrest after witnessed massive hematemesis.  In ED, profoundly hypotensive and received 6 units RBCs, 4 units FFP, 1 unit platelets on massive transfusion protocol.  He is a heavy drinker and takes large volumes of nonsteroidals.  MAJOR EVENTS/TEST RESULTS: 08/14 admission as documented above 08/14 gastroenterology consultation.  Deemed too unstable for EGD 08/15 RUQ [place]: Markedly diffusely echogenic liver. This could be due to steatosis, cirrhosis or chronic hepatitis. Small amount of ascites. Diffuse gallbladder wall thickening  08/15 nephrology consultation: CRRT initiated 08/15 intermittently agitated.  Low-dose midazolam infusion initiated 08/16 - 08/18 remains on midaz 2-4 mg/hr for ventilator synchrony.  Remains on vasopressors and CRRT.  Remains intubated, FiO2 60%.  Not following commands.  Withdraws from pain.  Family updated on daily basis and poor prognosis conveyed with little evidence of improvement.  08/19 No change overall. Remains comatose off midaz gtt. Remains on vasopressors.  Remains anuric on CRRT.   INDWELLING DEVICES:: R femoral cordis 08/14 >> 08/15 ETT 08/14 >>  R IJ CVL 08/14 >>  L IJ HD cath 08/14 >>   MICRO DATA:  SARS-CoV-2 PCR 8/14 >> NEG  Blood 8/14 >> NEG Resp 08/17 >> abundant stenotrophomonas, moderate Serratia  ANTIMICROBIALS:  Ceftriaxone 08/14 >>   PCT 08/17: 3.09  SUBJ: Off of all sedation.  Comatose.  Synchronous with ventilator.  Remains on CRRT.  Remains on vasopressors.  OBJ: Vitals:   06/28/19 0731 06/28/19 0800 06/28/19 0900 06/28/19 1144  BP:  (!) 87/60 (!) 83/59   Pulse:  71 73   Resp:  12 13   Temp:  (!) 97.2 F (36.2 C) (!) 97.5 F (36.4 C)   TempSrc:  Bladder    SpO2: 94% 94% 94% 94%  Weight:      Height:       Vent Mode: PCV FiO2 (%):  [60 %] 60 % Set Rate:  [12 bmp] 12  bmp PEEP:  [5 cmH20] 5 cmH20    Gen: intubated, RASS -4, not following commands, severe icterus HEENT: Severe sclericterus, NCAT Neck: No LAN, no JVD noted Lungs: Bilateral rhonchi, no wheezes anteriorly, + cough with ET suctioning Cardiovascular: Regular, no M Abdomen: Mildly distended, soft, NT, + BS Ext: Warm, symmetric ankle and pedal edema Neuro: PERRL, EOMI, no spontaneous movement, no withdrawal from pain Skin: Severe jaundice, extensive ecchymoses on BUE   BMP Latest Ref Rng & Units 06/28/2019 06/28/2019 06/28/2019  Glucose 70 - 99 mg/dL 155(H) 124(H) 123(H)  BUN 8 - 23 mg/dL 34(H) 29(H) 30(H)  Creatinine 0.61 - 1.24 mg/dL 1.05 0.83 0.84  BUN/Creat Ratio 9 - 20 - - -  Sodium 135 - 145 mmol/L 136 136 135  Potassium 3.5 - 5.1 mmol/L 4.6 4.4 4.6  Chloride 98 - 111 mmol/L 104 101 104  CO2 22 - 32 mmol/L _0 Calcium 8.9 - 10.3 mg/dL 7.8(L) 8.0(L) 7.6(L)    Hepatic Function Latest Ref Rng & Units 06/28/2019 06/28/2019 06/28/2019  Total Protein 6.5 - 8.1 g/dL - 5.9(L) -  Albumin 3.5 - 5.0 g/dL 1.8(L) 1.9(L) 1.8(L)  AST 15 - 41 U/L - 246(H) -  ALT 0 - 44 U/L - UNABLE TO REPORT DUE TO ICTERUS -  Alk Phosphatase 38 - 126 U/L - 189(H) -  Total Bilirubin 0.3 - 1.2 mg/dL - 29.5(HH) -  Bilirubin, Direct 0.0 - 0.2 mg/dL - 18.3(H) -  CBC Latest Ref Rng & Units 06/28/2019 06/27/2019 06/26/2019  WBC 4.0 - 10.5 K/uL 17.3(H) 12.7(H) 11.2(H)  Hemoglobin 13.0 - 17.0 g/dL 14.4 14.0 14.0  Hematocrit 39.0 - 52.0 % 41.4 40.1 39.9  Platelets 150 - 400 K/uL 56(L) 62(L) 78(L)   ABG    Component Value Date/Time   PHART 7.47 (H) 06/24/2019 0451   PCO2ART 31 (L) 06/24/2019 0451   PO2ART 81 (L) 06/24/2019 0451   HCO3 22.6 06/24/2019 0451   ACIDBASEDEF 0.2 06/24/2019 0451   O2SAT 96.6 06/24/2019 0451     CXR: NSC to slightly improved low lung volumes, bibasilar opacities, suspect atelectasis   IMPRESSION: 1) Admitted with massive UGIB, hemorrhagic shock, brief out of hosp cardiac  arrest 2) Ventilator dependent respiratory failure. Intubated due to AMS/cardiac arrest 3) vasopressor dependent shock 4) anuric AKI  5) Metabolic acidosis, resolved on CRRT 6) Hyperkalemia, resolved on CRRT  7) Mild hyponatremia, resolved on CRRT 8) UGIB - variceal vs gastric (heavy alcohol abuse, heavy NSAIDs use) 9) Acute liver failure due to alcoholic hepatitis 10) underlying cirrhosis, likely alcoholic 11) Acute blood loss anemia - not overtly bleeding at present time 12) moderate thrombocytopenia 13) Coagulopathy due to liver failure 14) Acute encephalopathy, multifactorial - metabolic, alcohol withdrawal 15) history of heavy alcohol abuse prior to admission 16) ICU/ventilator associated discomfort  PLAN/REC: Cont vent support - settings reviewed and/or adjusted Cont vent bundle Daily SBT if/when meets criteria Continue ICU hemodynamic monitoring Wean norepinephrine to off for MAP >65 mmHg Cont hydrocortisone at current dose Monitor BMET intermittently Monitor I/Os Correct electrolytes as indicated Cont  CRRT - initiated 8/15 Cont enteral pantoprazole Octreotide infusion discontinued 08/16 Continue TF protocol initiated 8/18 Discontinue SSI (not requiring) Monitor temp, WBC count Micro and abx as above DVT px: SCDs Monitor CBC intermittently Transfuse per usual guidelines   I again updated the patient's wife and daughter in detail.  I explained that there has been no significant change clinically in the past 24 hours.  I again emphasized that his prognosis for functional survival is poor.  Patient's wife expressed her belief that "he would not want to live like this".  She is ready to move towards withdrawal and comfort measures only.  For personal reasons, she wishes to proceed on Friday 8/21.  Patient remains DNR.  I will initiate morphine infusion to ensure that he is not suffering.  We will continue the rest of his care as is for now.  If CRRT machine malfunctions, we  will not restart it.  Discussed with Dr. Juleen China and we might decide to discontinue CRRT altogether 8/20.    CCM time: 40 mins The above time includes time spent in consultation with patient and/or family members and reviewing care plan on multidisciplinary rounds  Merton Border, MD PCCM service Mobile [phone] Pager [phone] 06/28/2019 4:13 PM

## 2019-06-28 NOTE — Progress Notes (Signed)
Spoke with Westley Chandler, NP regarding patient being unresponsive to pain stimulation. At this time no new orders and to continue morphine drip at current rate. Rass of -5.

## 2019-06-28 NOTE — Progress Notes (Signed)
[place] [facility] [place] NAME: Trevor Lopez    MR#:  [id number]  DATE OF BIRTH:  1956/04/10  SUBJECTIVE:  CHIEF COMPLAINT:   Chief Complaint  Patient presents with  . GI Bleeding   Dr. Rosita Fire spoke with patient's family, patient being on 2 pressors and prognosis poor wife wants to start comfort measures from tomorrow. REVIEW OF SYSTEMS:    Review of Systems  Unable to perform ROS: Intubated   DRUG ALLERGIES:  No Known Allergies  VITALS:  Blood pressure (!) 83/59, pulse 73, temperature (!) 97.5 F (36.4 C), resp. rate 13, height 5' 4.02" (1.626 m), weight 86.1 kg, SpO2 94 %.  PHYSICAL EXAMINATION:   Physical Exam  GENERAL:  63 y.o.-year-old patient lying in the bed. Sedated. ETT.  Jaundiced EYES: Pupils equal, round, reactive to light  +scleral icterus. Extraocular muscles intact.  HEENT: Head atraumatic, normocephalic. Oropharynx and nasopharynx clear.  NECK:  Supple, no jugular venous distention. No thyroid enlargement, no tenderness.  LUNGS: Normal breath sounds bilaterally.  CARDIOVASCULAR: S1, S2 normal. No murmurs, rubs, or gallops.  ABDOMEN: Soft, nontender, nondistended. Bowel sounds present. No organomegaly or mass.  EXTREMITIES: No cyanosis, clubbing or edema b/l.    PSYCHIATRIC: The patient is sedated. SKIN: No obvious rash, lesion, or ulcer.   LABORATORY PANEL:   CBC Recent Labs  Lab 06/28/19 0540  WBC 17.3*  HGB 14.4  HCT 41.4  PLT 56*   ------------------------------------------------------------------------------------------------------------------ Chemistries  Recent Labs  Lab 06/28/19 0540 06/28/19 1211  NA 136 136  K 4.4 4.6  CL 101 104  CO2 24 23  GLUCOSE 124* 155*  BUN 29* 34*  CREATININE 0.83 1.05  CALCIUM 8.0* 7.8*  MG 2.0 2.0  AST 246*  --   ALT UNABLE TO REPORT DUE TO ICTERUS  --   ALKPHOS 189*  --   BILITOT 29.5*  --     ------------------------------------------------------------------------------------------------------------------  Cardiac Enzymes No results for input(s): TROPONINI in the last 168 hours. ------------------------------------------------------------------------------------------------------------------  RADIOLOGY:  Dg Chest Port 1 View  Result Date: 06/28/2019 CLINICAL DATA:  Respiratory failure. EXAM: PORTABLE CHEST 1 VIEW COMPARISON:  One-view chest x-ray 06/27/2019. FINDINGS: Endotracheal tube is stable, 3.3 cm above the carina. Side port of the NG tube is in the fundus of the stomach. Bilateral IJ lines are stable. Lung volumes are low. Interstitial and airspace disease is again seen, predominantly in the lower lobes. No definite effusions are present. Aeration is slightly improved. IMPRESSION: 1. Slight improvement of interstitial and airspace disease in the lower lobes bilaterally. Differential diagnosis includes edema or infection. No significant consolidation present. 2. Support apparatus is stable. 3. Persistent low lung volumes. Electronically Signed   By: San Morelle M.D.   On: 06/28/2019 06:25   Dg Chest Port 1 View  Result Date: 06/27/2019 CLINICAL DATA:  Respiratory failure EXAM: PORTABLE CHEST 1 VIEW COMPARISON:  06/26/2019 FINDINGS: Cardiac shadow is stable. Endotracheal tube and nasogastric catheter are noted and stable. Bilateral jugular catheters are again seen and stable. The overall inspiratory effort is again poor with crowding of the parenchymal markings. Previously seen bibasilar atelectatic changes are again noted. No new focal abnormality is noted. IMPRESSION: Stable bibasilar atelectasis. Tubes and lines as described. Electronically Signed   By: Inez Catalina M.D.   On: 06/27/2019 07:06     ASSESSMENT AND PLAN:   * Upper GI bleed likely variceal bleed Bleeding has stopped at this time. Protonix and octreotide drip stopped.  Now on Protonix IV twice  daily GI following patient Hemoglobin stable.  Continue PPIs.  GI on case.  Patient has multiorgan failure with acute liver failure, renal failure, severe alcoholic hepatitis with upper GI bleed, respiratory failure, patient prognosis poor, now DNR.  Spoke with patient's wife, daughter by ICU physician, wants to start comfort measures from tomorrow.  Continue current meds, started on morphine infusion.   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 Monitor For prognosis  *Cardiac arrest secondary to hypovolemic shock. Presently intubated.  Continue pressors. *Acute kidney injury with hyperkalemia.   On CRRT  Poor prognosis with multiorgan failure at this time.  Palliative care consulted, comfort measures to be initiated tomorrow as per wife wishes.  All the records are reviewed and case discussed with Care Management/Social Worker Management plans discussed with the patient, family and they are in agreement.  CODE STATUS: DNR  DVT Prophylaxis: SCDs  TOTAL TIME TAKING CARE OF THIS PATIENT: 35 minutes.   Epifanio Lesches M.D on 06/28/2019 at 4:40 PM  Between 7am to 6pm - Pager - [phone]  After 6pm go to [email or web address] - password [email or web address] [facility]  Office  [phone]  CC: Primary care physician; Guadalupe Maple, MD  Note: This dictation was prepared with Dragon dictation along with smaller phrase technology. Any transcriptional errors that result from this process are unintentional.

## 2019-06-28 NOTE — Progress Notes (Signed)
Nutrition Follow-up  DOCUMENTATION CODES:   Obesity unspecified  INTERVENTION:  Patient currently on Vital High Protein at 20 mL/hr. Continue advancing by 10 mL/hr every 8 hours to goal rate of 50 mL/hr. Also provide Pro-Stat 30 mL once daily per tube. Goal regimen provides 1300 kcal, 120 grams of protein, 1008 mL H2O daily.  Provide minimum free water flush of 30 mL Q4hrs.   Provide B-complex with C QHS per tube.  NUTRITION DIAGNOSIS:   Inadequate oral intake related to inability to eat as evidenced by NPO status.  Ongoing.  GOAL:   Provide needs based on ASPEN/SCCM guidelines  Progressing with advancement of tube feeds.  MONITOR:   Vent status, Labs, Weight trends, TF tolerance, I & O's  REASON FOR ASSESSMENT:   Ventilator    ASSESSMENT:   63 year old male with PMHx of BPH, tubular adenoma of colon, psoriasis, previously undiagnosed cirrhosis, EtOH abuse, admitted after brief out of hospital cardiac arrest after witnessed massive hematemesis, with hemorrhagic shock s/p massive transfusion protocol, upper GI bleed in setting of heavy EtOH abuse and heavy NSAIDs use, intubated due to AMS on 8/14, also with oliguric AKI, metabolic acidosis, acute liver failure in setting of suspected alcoholic cirrhosis and suspected ascites.  Patient intubated. Not requiring any sedation. On PCV with FiO2 60%, Pressure Control 16 cmH2O, PEEP 5 cmH2O. Abdomen remains distended and taut per RN documentation. He did have a BM yesterday and has now had two this morning as well. Patient is on CRRT.  Enteral Access: 16 Fr. OGT placed 8/14; tube terminates in fundus of stomach per chest x-ray 8/19; 60 cm at corner of mouth  MAP: 68-78 mmHg  TF: pt tolerating Vital High Protein at 20 mL/hr and advancing   Patient is currently intubated on ventilator support Ve: 9.5 L/min Temp (24hrs), Avg:97.9 F (36.6 C), Min:96.8 F (36 C), Max:98.8 F (37.1 C)  Propofol: N/A  Medications reviewed and  include: Solu-Cortef 50 mg Q12hrs IV, lactulose 20 grams BID per tube, pantoprazole, ceftriaxone, norepinephrine gtt at 15 mcg/min.  Labs reviewed: CBG 112-125, BUN 29.  I/O: 37 mL UOP yesterday; 1107 mL output from UF yesterday  Discussed with RN and on rounds.  Diet Order:   Diet Order    None     EDUCATION NEEDS:   No education needs have been identified at this time  Skin:  Skin Assessment: Reviewed RN Assessment  Last BM:  06/28/2019 - large type 7  Height:   Ht Readings from Last 1 Encounters:  06/27/19 5' 4.02" (1.626 m)   Weight:   Wt Readings from Last 1 Encounters:  06/28/19 86.1 kg   Ideal Body Weight:  59.1 kg  BMI:  Body mass index is 32.57 kg/m.  Estimated Nutritional Needs:   Kcal:  [phone] (11-14 kcal/kg)  Protein:  118 grams (2 grams/kg IBW)  Fluid:  1.8 L/day  Willey Blade, MS, RD, LDN Office: [phone] Pager: [phone] After Hours/Weekend Pager: [phone]

## 2019-06-28 NOTE — Consult Note (Signed)
Consultation Note Date: 06/28/2019   Patient Name: Trevor Lopez  DOB: 11-28-1955  MRN: [id number]  Age / Sex: 63 y.o., male   PCP: Guadalupe Maple, MD Referring Physician: Epifanio Lesches, MD   REASON FOR CONSULTATION:Establishing goals of care  Palliative Care consult requested for this 63 y.o. male with multiple medical problems including psoriasis, hypothyroidism, BPH, alcohol abuse. Patient presented to ED via EMS from home. Wife reports patient was weak, had several falls the morning of admission, and after attempting to go to the bathroom collapsed and vomited blood. EMS provided 3-4 minutes of CPR and administered 1 dose of epi after patient suffered cardiac arrest. ROSC obtained. Patient became unresponsive in ED requiring intubation. He received 2 units of packed red blood cells, 2 units of platelets and 1 FFP.  Patient is being followed by GI.  Fully not a candidate for EGD due to multiple arrest and criticalness. He remains intubated with no improvement. Sedation has been discontinued.   Clinical Assessment and Goals of Care: I have reviewed medical records including lab results, imaging, Epic notes, and MAR, received report from the bedside RN, and assessed the patient. I met at the bedside with patient's wife Juwuan Sedita) and his daughter Hardie Shackleton) to discuss diagnosis prognosis, [place], EOL wishes, disposition and options.  I introduced Palliative Medicine as specialized medical care for people living with serious illness. It focuses on providing relief from the symptoms and stress of a serious illness. The goal is to improve quality of life for both the patient and the family.  We discussed a brief life review of the patient, along with his functional and nutritional status.  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  He enjoyed golfing, yard work, and spending time with family.  They have been married for 39 years with and upcoming  40th anniversary on August 30. They have 2 children. Wife shares her son has a congenital heart condition and is on the list for a transplant.   Prior to admission patient was able to perform all ADLs independently. He recently mowed the grass 2 days prior to admission. Wife does report noticeable gait instability over the past month with several occassions of losing his balance with near falls. She reports he had a good appetite. Wife sharing her passion for cake baking and how patient assisted with making the detailed artwork that she created on her cakes.   Wife is tearful and angrily stating "I just wished he wouldn't have drank. Now look at him. I never would have imagined it was this serious!" She reports patient and her assist in the care of Vaden's mother who has functional dementia. She states she noticed he was drinking more than socially about 1.5 years ago but he would never admit it. She began finding hidden liquor bottles all over the house and became aware that he would get up in the early hours and she could hear him pouring a drink and late nights. He would often send her out of the house if they were going somewhere and she figured he was drinking due to the smell. She state she addressed it but he would always deny and she never pressed the issue. Daughter, Estill Bamberg tearful stating this is the first she has heard all of this and cannot believe she was not aware. Support given. Wife reports noticing color changes and he began complaining about his lower back, side, and stomach  hurting over the past few months. She figured he had increased his alcohol intake to self-medicate.   We discussed His current illness and what it means in the larger context of His on-going co-morbidities. With specific discussions regarding GI bleed, renal failure, respiratory failure, and prognosis.  Natural disease trajectory and expectations at EOL were discussed.  Family tearful and appreciative of the updates.  Daughter states "I just don't want him to suffer. He looks comfortable but he doesn't look like himself" I can't imagine seeing him like this much longer!" Support given. Wife verbalized her agreement with daughter's statement.   I attempted to elicit values and goals of care important to the patient.    The difference between aggressive medical intervention and comfort care was considered in light of the patient's goals of care. Daughter asking about different pathways knowing some decisions would need to be made in the upcoming days. We discussed aggressive medical intervention and what this would look like for the patient long-term including potential out of state LTAC placement. I educated family on despite aggressive interventions this would not change patient's overall condition and illness and he would eventually pass away after going through all of these. We discussed in detailed comfort care including one-way extubation, morphine drip for comfort, along with other medications and support to manage symptoms such as shortness of breath, agitation, and excessive secretions etc. Family tearful in expressing their awareness and understanding. Both daughter and wife tearful in stating "we know he would never want to live in his current state. Keeping him comfortable and not allowing him to suffer is going to be what is best for him." Emotional support provided.   Wife is tearful stating, "I know what decision to make but I just can't make it. I don't know if I can be the one to say just let him go!" Support given. Used this opportunity to support family and share with them as family (wife and child), they are not making a decision to give up and just let him go but to have a peaceful and respectful death without suffering or putting him through interventions they know he would not want. Shared with them they are his voice and an advocate for him, his wishes, and care. Wife expressed appreciation.    Daughter inquiring if they had to make a decision today or if they could wait until tomorrow or Friday knowing he has recently been taken off of sedation. She states they would like to be sure and comfortable when they make their final decision.   I used this opportunity to ask family how patient's son was dealing with this situation. His mother reports he is doing ok, he does not want to come in due to his critical health. Estill Bamberg shares, he has some anger and have expressed he doesn't want updates and to just let him know when he is gone. She states she feels that he is dealing with similar feelings that she has; anger not knowing he had an alcohol problem. Support given.   Family expresses the know they are not considering aggressive measures and are leaning towards comfort for EOL. Wife again requesting additional time for watchful waiting and to come to terms with the fact she is losing her "everything". Spiritual support offered, however family declined at this time.   Patient did not have an advanced directive, however wife reports they have already expressed to each other not wanting to be on life-support or suffer. Family confirms DNR.  Questions and concerns were addressed. The family was encouraged to call with questions or concerns.  PMT will continue to support holistically.   SOCIAL HISTORY:     reports that he has been smoking cigarettes. He has been smoking about 0.25 packs per day. He has never used smokeless tobacco. He reports current alcohol use. He reports that he does not use drugs.  CODE STATUS: DNR  ADVANCE DIRECTIVES: Wife    SYMPTOM MANAGEMENT: per attending   Palliative Prophylaxis:   Aspiration, Eye Care, Frequent Pain Assessment, Oral Care and Turn Reposition  PSYCHO-SOCIAL/SPIRITUAL:  Support System: Family   Desire for further Chaplaincy support:NO   Additional Recommendations (Limitations, Scope, Preferences):  Continue to treat    PAST MEDICAL  HISTORY: Past Medical History:  Diagnosis Date  . BPH without urinary obstruction   . Psoriasis   . Tubular adenoma of colon May 2009   81m    PAST SURGICAL HISTORY:  Past Surgical History:  Procedure Laterality Date  . COLONOSCOPY  May 2009   Dr. WAllen Norris 153mTubular adenoma    ALLERGIES:  has No Known Allergies.   MEDICATIONS:  Current Facility-Administered Medications  Medication Dose Route Frequency Provider Last Rate Last Dose  . 0.9 %  sodium chloride infusion   Intravenous PRN SiWilhelmina McardleMD   Stopped at 06/27/19 2004  . albuterol (PROVENTIL) (2.5 MG/3ML) 0.083% nebulizer solution 2.5 mg  2.5 mg Nebulization Q2H PRN Sudini, SrAlveta HeimlichMD      . B-complex with vitamin C tablet 1 tablet  1 tablet Per Tube QHS SiWilhelmina McardleMD      . cefTRIAXone (ROCEPHIN) 1 g in sodium chloride 0.9 % 100 mL IVPB  1 g Intravenous Q24H Sudini, Srikar, MD 200 mL/hr at 06/28/19 1100 1 g at 06/28/19 1100  . chlorhexidine gluconate (MEDLINE KIT) (PERIDEX) 0.12 % solution 15 mL  15 mL Mouth Rinse BID Tukov-Yual, Magdalene S, NP   15 mL at 06/28/19 0840  . Chlorhexidine Gluconate Cloth 2 % PADS 6 each  6 each Topical Q0600 BlAwilda BillNP   6 each at 06/28/19 0530  . [START ON 06/29/2019] feeding supplement (PRO-STAT SUGAR FREE 64) liquid 30 mL  30 mL Per Tube Daily SiWilhelmina McardleMD      . feeding supplement (VITAL HIGH PROTEIN) liquid 1,000 mL  1,000 mL Per Tube Continuous SiWilhelmina McardleMD      . fentaNYL (SUBLIMAZE) injection 50 mcg  50 mcg Intravenous Q2H PRN SiWilhelmina McardleMD      . heparin injection 1,000-6,000 Units  1,000-6,000 Units CRRT PRN LaHolley RaringMunsoor, MD   2,800 Units at 06/28/19 1117  . hydrocortisone sodium succinate (SOLU-CORTEF) 100 MG injection 50 mg  50 mg Intravenous Q12H SiWilhelmina McardleMD      . MEDLINE mouth rinse  15 mL Mouth Rinse 10 times per day Tukov-Yual, Magdalene S, NP   15 mL at 06/28/19 1409  . midazolam (VERSED) injection 2 mg  2 mg Intravenous  Q1H PRN SiWilhelmina McardleMD      . morphine 10018mn NS 100m28mmg/37m infusion - premix  5 mg/hr Intravenous Continuous SimonWilhelmina Mcardle5 mL/hr at 06/28/19 1409 5 mg/hr at 06/28/19 1409  . norepinephrine (LEVOPHED) 16 mg in 250mL 42mix infusion  0-40 mcg/min Intravenous Continuous SimondWilhelmina Mcardle4.06 mL/hr at 06/28/19 1009 15 mcg/min at 06/28/19 1009  . ondansetron (ZOFRAN) injection 4 mg  4 mg Intravenous  Q6H PRN Hillary Bow, MD      . pantoprazole (PROTONIX) injection 40 mg  40 mg Intravenous Q12H Wilhelmina Mcardle, MD   40 mg at 06/28/19 1100  . pureflow IV solution for Dialysis   CRRT Continuous Lateef, Munsoor, MD 2,500 mL/hr at 06/28/19 0500 3 each at 06/28/19 0500  . sodium chloride flush (NS) 0.9 % injection 10-40 mL  10-40 mL Intracatheter Q12H Awilda Bill, NP   10 mL at 06/27/19 2240  . sodium chloride flush (NS) 0.9 % injection 10-40 mL  10-40 mL Intracatheter PRN Awilda Bill, NP      . sodium chloride flush (NS) 0.9 % injection 3 mL  3 mL Intravenous Q12H Sudini, Alveta Heimlich, MD   3 mL at 06/27/19 2240    VITAL SIGNS: BP (!) 83/59   Pulse 73   Temp (!) 97.5 F (36.4 C)   Resp 13   Ht 5' 4.02" (1.626 m)   Wt 86.1 kg   SpO2 94%   BMI 32.57 kg/m  Filed Weights   06/26/19 0237 06/27/19 0500 06/28/19 0500  Weight: 90 kg 88.6 kg 86.1 kg    Estimated body mass index is 32.57 kg/m as calculated from the following:   Height as of this encounter: 5' 4.02" (1.626 m).   Weight as of this encounter: 86.1 kg.  LABS: CBC:    Component Value Date/Time   WBC 17.3 (H) 06/28/2019 0540   HGB 14.4 06/28/2019 0540   HGB 18.2 (H) 05/08/2016 1120   HCT 41.4 06/28/2019 0540   HCT 52.9 (H) 05/08/2016 1120   PLT 56 (L) 06/28/2019 0540   PLT 196 05/08/2016 1120   Comprehensive Metabolic Panel:    Component Value Date/Time   NA 136 06/28/2019 1211   NA 140 04/14/2016 1416   K 4.6 06/28/2019 1211   CO2 23 06/28/2019 1211   BUN 34 (H) 06/28/2019 1211   BUN 9  04/14/2016 1416   CREATININE 1.05 06/28/2019 1211   ALBUMIN 1.8 (L) 06/28/2019 1211   ALBUMIN 4.1 04/14/2016 1416     Review of Systems  Unable to perform ROS: Intubated   Physical Exam General: critically-ill appearing, intubated Cardiovascular: regular rate and rhythm Pulmonary: bilateral rhonchi, vent support.  Abdomen: soft, nontender, + bowel sounds, distended Extremities: bilateral pedal edema, no joint deformities Skin: no rashes, upper extremity ecchymosis, jaundice, icterus Neurological: will not follow commands, intubated   Prognosis: POOR in the setting of cardiac arrest requiring intubation, multiorgan failure, alcohol abuse, GIB, liver failure/cirrhosis, thrombocytopenia, renal failure requiring CRRT.  Discharge Planning:  Anticipated Hospital Death once family makes decision for one-way extubation and comfort.   Recommendations:  DNR-as confirmed by family  Continue with current plan of care as requested by family.  Wife/daughter leaning towards one-way extubation and comfort.  Requesting additional time with a limit set for Friday a final decision allowing family comfort with the process.    PMT will continue to support and follow   Palliative Performance Scale: INTUBATED               Family expressed understanding and was in agreement with this plan.   Thank you for allowing the Palliative Medicine Team to assist in the care of this patient.  Time In: 1115 Time Out: 1230 Time Total: 75 min.   Visit consisted of counseling and education dealing with the complex and emotionally intense issues of symptom management and palliative care in the setting of serious and potentially life-threatening  illness.Greater than 50%  of this time was spent counseling and coordinating care related to the above assessment and plan.  Signed by:  Alda Lea, AGPCNP-BC Palliative Medicine Team  Phone: [phone] Fax: [phone] Pager: [phone]  Amion: Bjorn Pippin

## 2019-06-28 NOTE — Progress Notes (Signed)
[facility]  ROUNDING NOTE   Subjective:   On norepinephrine gtt  Weaned vasopressin gtt.   CRRT UF 1140m.  UOP 392m  Objective:  Vital signs in last 24 hours:  Temp:  [96.8 F (36 C)-98.8 F (37.1 C)] 97.5 F (36.4 C) (08/19 0900) Pulse Rate:  [60-85] 73 (08/19 0900) Resp:  [10-15] 13 (08/19 0900) BP: (77-108)/(52-78) 83/59 (08/19 0900) SpO2:  [90 %-96 %] 94 % (08/19 0900) FiO2 (%):  [60 %] 60 % (08/19 0800) Weight:  [86.1 kg] 86.1 kg (08/19 0500)  Weight change: -2.5 kg Filed Weights   06/26/19 0237 06/27/19 0500 06/28/19 0500  Weight: 90 kg 88.6 kg 86.1 kg    Intake/Output: I/O last 3 completed shifts: In: 1610.3 [I.V.:1042.6; NG/GT:467.7; IV Piggyback:100] Out: 1742 [Urine:55; OtYQIHK:7425] Intake/Output this shift:  Total I/O In: 129.5 [I.V.:69.5; NG/GT:60] Out: -   Physical Exam: General: Critically ill  Head: ETT  Eyes: +icterus  Neck: Supple, trachea midline  Lungs:   Pressure Control. FIO2 60%  Heart: Regular rate and rhythm  Abdomen:  Soft, nontender,   Extremities:  no peripheral edema.  Neurologic: Intubated and sedated  Skin: +jaundice  Access: Left IJ temp dialysis 06/18/55  Basic Metabolic Panel: Recent Labs  Lab 06/27/19 0547 06/27/19 1158 06/27/19 1749 06/28/19 0000 06/28/19 0540  NA 136  135 133* 135 135 136  K 4.6  4.6 5.4* 4.7 4.6 4.4  CL 103  102 103 102 104 101  CO2 23  25 17* _0 GLUCOSE 106*  109* 104* 113* 123* 124*  BUN 31*  31* 30* 31* 30* 29*  CREATININE 1.00  UNABLE TO REPORT DUE TO ICTERUS UNABLE TO REPORT DUE TO ICTERUS 0.79 0.84 0.83  CALCIUM 7.8*  7.7* 7.4* 7.9* 7.6* 8.0*  MG 1.9 2.4 2.0 1.9 2.0  PHOS 2.9 UNABLE TO REPORT DUE TO ICTERUS 3.6 3.2 UNABLE TO REPORT DUE TO ICTERUS  2.9    Liver Function Tests: Recent Labs  Lab 06/10/2019 2140 06/24/19 0500  06/25/19 0608  06/27/19 0547 06/27/19 1158 06/27/19 1749 06/28/19 0000 06/28/19 0540  AST 233* 257*  --  317*  --  268*   --   --   --  246*  ALT UNABLE TO REPORT DUE TO ICTERIC INTERFERENCE NOT VALID  --  UNABLE TO REPORT DUE TO ICTERUS  --  UNABLE TO REPORT DUE TO ICTERUS  --   --   --  UNABLE TO REPORT DUE TO ICTERUS  ALKPHOS 163* 164*  --  175*  --  207*  --   --   --  189*  BILITOT 27.4* 31.0*  --  31.1*  --  29.9*  --   --   --  29.5*  PROT 5.5* 5.4*  --  5.7*  --  5.7*  --   --   --  5.9*  ALBUMIN 2.2* 2.2*   < > 2.1*   < > 1.8*  1.9* 1.9* 2.0* 1.9* 1.9*  1.8*   < > = values in this interval not displayed.   No results for input(s): LIPASE, AMYLASE in the last 168 hours. Recent Labs  Lab 06/24/2019 1715  AMMONIA 51*    CBC: Recent Labs  Lab 07/05/2019 1321  06/24/19 0500 06/25/19 0608 06/26/19 0518 06/27/19 0547 06/28/19 0540  WBC 17.0*   < > 9.3 11.9* 11.2* 12.7* 17.3*  NEUTROABS 10.7*  --   --   --   --   --   --  HGB 7.1*   < > 13.5 14.1 14.0 14.0 14.4  HCT 20.5*   < > 36.3* 38.7* 39.9 40.1 41.4  MCV 116.5*   < > 86.6 91.1 93.4 94.4 95.2  PLT 239   < > 101* 96* 78* 62* 56*   < > = values in this interval not displayed.    Cardiac Enzymes: No results for input(s): CKTOTAL, CKMB, CKMBINDEX, TROPONINI in the last 168 hours.  BNP: Invalid input(s): POCBNP  CBG: Recent Labs  Lab 06/27/19 1807 06/27/19 2008 06/28/19 0003 06/28/19 0349 06/28/19 0743  GLUCAP 112* 106* 108* 114* 112*    Microbiology: Results for orders placed or performed during the hospital encounter of 06/20/2019  Blood Culture (routine x 2)     Status: None   Collection Time: 06/17/2019 12:38 PM   Specimen: BLOOD  Result Value Ref Range Status   Specimen Description BLOOD LEFT ANTECUBITAL  Final   Special Requests   Final    BOTTLES DRAWN AEROBIC AND ANAEROBIC Blood Culture adequate volume   Culture   Final    NO GROWTH 5 DAYS Performed at [facility], [address], [place], [place] [address]    Report Status 06/28/2019 FINAL  Final  SARS Coronavirus 2 [facility] order, Performed in [facility]  hospital lab) Nasopharyngeal Nasopharyngeal Swab     Status: None   Collection Time: 06/28/2019 12:58 PM   Specimen: Nasopharyngeal Swab  Result Value Ref Range Status   SARS Coronavirus 2 NEGATIVE NEGATIVE Final    Comment: (NOTE) If result is NEGATIVE SARS-CoV-2 target nucleic acids are NOT DETECTED. The SARS-CoV-2 RNA is generally detectable in upper and lower  respiratory specimens during the acute phase of infection. The lowest  concentration of SARS-CoV-2 viral copies this assay can detect is 250  copies / mL. A negative result does not preclude SARS-CoV-2 infection  and should not be used as the sole basis for treatment or other  patient management decisions.  A negative result may occur with  improper specimen collection / handling, submission of specimen other  than nasopharyngeal swab, presence of viral mutation(s) within the  areas targeted by this assay, and inadequate number of viral copies  (<250 copies / mL). A negative result must be combined with clinical  observations, patient history, and epidemiological information. If result is POSITIVE SARS-CoV-2 target nucleic acids are DETECTED. The SARS-CoV-2 RNA is generally detectable in upper and lower  respiratory specimens dur ing the acute phase of infection.  Positive  results are indicative of active infection with SARS-CoV-2.  Clinical  correlation with patient history and other diagnostic information is  necessary to determine patient infection status.  Positive results do  not rule out bacterial infection or co-infection with other viruses. If result is PRESUMPTIVE POSTIVE SARS-CoV-2 nucleic acids MAY BE PRESENT.   A presumptive positive result was obtained on the submitted specimen  and confirmed on repeat testing.  While 2019 novel coronavirus  (SARS-CoV-2) nucleic acids may be present in the submitted sample  additional confirmatory testing may be necessary for epidemiological  and / or clinical management  purposes  to differentiate between  SARS-CoV-2 and other Sarbecovirus currently known to infect humans.  If clinically indicated additional testing with an alternate test  methodology [phone]) is advised. The SARS-CoV-2 RNA is generally  detectable in upper and lower respiratory sp ecimens during the acute  phase of infection. The expected result is Negative. Fact Sheet for Patients:  StrictlyIdeas.no Fact Sheet for Healthcare Providers:  BankingDealers.co.za This test is not yet approved or cleared by the [place] and has been authorized for detection and/or diagnosis of SARS-CoV-2 by FDA under an Emergency Use Authorization (EUA).  This EUA will remain in effect (meaning this test can be used) for the duration of the COVID-19 declaration under Section 564(b)(1) of the Act, 21 U.S.C. section 360bbb-3(b)(1), unless the authorization is terminated or revoked sooner. Performed at [facility], [address] [place], [place] [address]   Blood Culture (routine x 2)     Status: None   Collection Time: 07/05/2019  6:59 PM   Specimen: BLOOD  Result Value Ref Range Status   Specimen Description BLOOD BLOOD RIGHT HAND  Final   Special Requests   Final    BOTTLES DRAWN AEROBIC AND ANAEROBIC Blood Culture results may not be optimal due to an inadequate volume of blood received in culture bottles   Culture   Final    NO GROWTH 5 DAYS Performed at [facility], [address] [place], [place] [address]    Report Status 06/28/2019 FINAL  Final  MRSA PCR Screening     Status: None   Collection Time: 06/26/19  9:13 AM   Specimen: Nasal Mucosa; Nasopharyngeal  Result Value Ref Range Status   MRSA by PCR NEGATIVE NEGATIVE Final    Comment:        The GeneXpert MRSA Assay (FDA approved for NASAL specimens only), is one component of a comprehensive MRSA colonization surveillance program. It is not intended to diagnose  MRSA infection nor to guide or monitor treatment for MRSA infections. Performed at [facility], [address] [place], [place] [address]   Culture, respiratory     Status: None (Preliminary result)   Collection Time: 06/26/19 11:52 AM   Specimen: Tracheal Aspirate; Respiratory  Result Value Ref Range Status   Specimen Description   Final    TRACHEAL ASPIRATE Performed at [facility], [address] [place], [place] [address]    Special Requests   Final    NONE Performed at [facility], [address] [place], [place] [address]    Gram Stain   Final    ABUNDANT WBC PRESENT,BOTH PMN AND MONONUCLEAR ABUNDANT GRAM NEGATIVE RODS MODERATE GRAM POSITIVE COCCI ABUNDANT YEAST Performed at [facility], [place] [address] [place],  [address]    Culture ABUNDANT GRAM NEGATIVE RODS  Final   Report Status PENDING  Incomplete    Coagulation Studies: Recent Labs    06/26/19 0518 06/27/19 0547 06/28/19 0540  LABPROT 17.1* 16.4* 16.7*  INR 1.4* 1.3* 1.4*    Urinalysis: No results for input(s): COLORURINE, LABSPEC, PHURINE, GLUCOSEU, HGBUR, BILIRUBINUR, KETONESUR, PROTEINUR, UROBILINOGEN, NITRITE, LEUKOCYTESUR in the last 72 hours.  Invalid input(s): APPERANCEUR    Imaging: Dg Chest Port 1 View  Result Date: 06/28/2019 CLINICAL DATA:  Respiratory failure. EXAM: PORTABLE CHEST 1 VIEW COMPARISON:  One-view chest x-ray 06/27/2019. FINDINGS: Endotracheal tube is stable, 3.3 cm above the carina. Side port of the NG tube is in the fundus of the stomach. Bilateral IJ lines are stable. Lung volumes are low. Interstitial and airspace disease is again seen, predominantly in the lower lobes. No definite effusions are present. Aeration is slightly improved. IMPRESSION: 1. Slight improvement of interstitial and airspace disease in the lower lobes bilaterally. Differential diagnosis includes edema or infection. No significant consolidation present. 2.  Support apparatus is stable. 3. Persistent low lung volumes. Electronically Signed  By: San Morelle M.D.   On: 06/28/2019 06:25   Dg Chest Port 1 View  Result Date: 06/27/2019 CLINICAL DATA:  Respiratory failure EXAM: PORTABLE CHEST 1 VIEW COMPARISON:  06/26/2019 FINDINGS: Cardiac shadow is stable. Endotracheal tube and nasogastric catheter are noted and stable. Bilateral jugular catheters are again seen and stable. The overall inspiratory effort is again poor with crowding of the parenchymal markings. Previously seen bibasilar atelectatic changes are again noted. No new focal abnormality is noted. IMPRESSION: Stable bibasilar atelectasis. Tubes and lines as described. Electronically Signed   By: Inez Catalina M.D.   On: 06/27/2019 07:06     Medications:   . sodium chloride Stopped (06/27/19 2004)  . cefTRIAXone (ROCEPHIN)  IV Stopped (06/27/19 1134)  . norepinephrine (LEVOPHED) Adult infusion 15 mcg/min (06/28/19 1009)  . pureflow 3 each (06/28/19 0500)   . chlorhexidine gluconate (MEDLINE KIT)  15 mL Mouth Rinse BID  . Chlorhexidine Gluconate Cloth  6 each Topical Q0600  . feeding supplement (PRO-STAT SUGAR FREE 64)  30 mL Per Tube BID  . feeding supplement (VITAL HIGH PROTEIN)  1,000 mL Per Tube Q24H  . hydrocortisone sod succinate (SOLU-CORTEF) inj  50 mg Intravenous Q12H  . lactulose  20 g Per Tube BID  . mouth rinse  15 mL Mouth Rinse 10 times per day  . pantoprazole  40 mg Intravenous Q12H  . rifaximin  550 mg Per Tube BID  . sodium chloride flush  10-40 mL Intracatheter Q12H  . sodium chloride flush  3 mL Intravenous Q12H   sodium chloride, albuterol, fentaNYL (SUBLIMAZE) injection, heparin, midazolam, [DISCONTINUED] ondansetron **OR** ondansetron (ZOFRAN) IV, sodium chloride flush  Assessment/ Plan:  Mr. Trevor Lopez is a 63 y.o. white male with alcohol abuse, psoriasis, tubular adenoma of the colon,tobacco abusewho was admitted to [facility] on 8/14/2020for evaluation of  severe GI bleeding and cardiac arrest. Started CRRT on 8/15 for acute renal failure  1. Acute renal failure with metabolic acidosis secondary to hypovolemic shock. Baseline creatinine of 0.95, normal GFR on 04/14/2016.  Anuric urine output. Requiring renal replacement therapy - Continue CRRT. Continue ultrafiltration as tolerated.   2. Hypovolemic shock/hypotension due to GI bleed. Requiring vasopressors: norepinephrine  Weaned off vasopressin  3. Acute respiratory failure: requiring mechanical ventilation  4. Multiorgan failure with elevated MELD score. Overall prognosis is poor.    LOS: 5 Callaway Hardigree 8/19/202010:29 AM

## 2019-06-29 LAB — HEPATIC FUNCTION PANEL
ALT: UNDETERMINED U/L (ref 0–44)
AST: 229 U/L — ABNORMAL HIGH (ref 15–41)
Albumin: 1.7 g/dL — ABNORMAL LOW (ref 3.5–5.0)
Alkaline Phosphatase: 175 U/L — ABNORMAL HIGH (ref 38–126)
Bilirubin, Direct: 16.6 mg/dL — ABNORMAL HIGH (ref 0.0–0.2)
Indirect Bilirubin: 10.1 mg/dL — ABNORMAL HIGH (ref 0.3–0.9)
Total Bilirubin: 26.7 mg/dL (ref 0.3–1.2)
Total Protein: 5.4 g/dL — ABNORMAL LOW (ref 6.5–8.1)

## 2019-06-29 LAB — RENAL FUNCTION PANEL
Albumin: 1.7 g/dL — ABNORMAL LOW (ref 3.5–5.0)
Albumin: 1.7 g/dL — ABNORMAL LOW (ref 3.5–5.0)
Anion gap: 7 (ref 5–15)
Anion gap: 8 (ref 5–15)
BUN: 29 mg/dL — ABNORMAL HIGH (ref 8–23)
BUN: 30 mg/dL — ABNORMAL HIGH (ref 8–23)
CO2: 24 mmol/L (ref 22–32)
CO2: 25 mmol/L (ref 22–32)
Calcium: 7.7 mg/dL — ABNORMAL LOW (ref 8.9–10.3)
Calcium: 8 mg/dL — ABNORMAL LOW (ref 8.9–10.3)
Chloride: 103 mmol/L (ref 98–111)
Chloride: 103 mmol/L (ref 98–111)
Creatinine, Ser: 0.79 mg/dL (ref 0.61–1.24)
Creatinine, Ser: 0.9 mg/dL (ref 0.61–1.24)
GFR calc Af Amer: >60 mL/min (ref >60)
GFR calc Af Amer: >60 mL/min (ref >60)
GFR calc non Af Amer: >60 mL/min (ref >60)
GFR calc non Af Amer: >60 mL/min (ref >60)
Glucose, Bld: 141 mg/dL — ABNORMAL HIGH (ref 70–99)
Glucose, Bld: 153 mg/dL — ABNORMAL HIGH (ref 70–99)
Phosphorus: 3 mg/dL (ref 2.5–4.6)
Phosphorus: 2.8 mg/dL (ref 2.5–4.6)
Potassium: 4.3 mmol/L (ref 3.5–5.1)
Potassium: 4.9 mmol/L (ref 3.5–5.1)
Sodium: 134 mmol/L — ABNORMAL LOW (ref 135–145)
Sodium: 136 mmol/L (ref 135–145)

## 2019-06-29 LAB — GLUCOSE, CAPILLARY
Glucose-Capillary: 134 mg/dL — ABNORMAL HIGH (ref 70–99)
Glucose-Capillary: 140 mg/dL — ABNORMAL HIGH (ref 70–99)
Glucose-Capillary: 148 mg/dL — ABNORMAL HIGH (ref 70–99)
Glucose-Capillary: 176 mg/dL — ABNORMAL HIGH (ref 70–99)
Glucose-Capillary: 179 mg/dL — ABNORMAL HIGH (ref 70–99)
Glucose-Capillary: 192 mg/dL — ABNORMAL HIGH (ref 70–99)

## 2019-06-29 LAB — CBC
HCT: 39.7 % (ref 39.0–52.0)
Hemoglobin: 13.9 g/dL (ref 13.0–17.0)
MCH: 33.3 pg (ref 26.0–34.0)
MCHC: 35 g/dL (ref 30.0–36.0)
MCV: 95 fL (ref 80.0–100.0)
Platelets: 57 10*3/uL — ABNORMAL LOW (ref 150–400)
RBC: 4.18 MIL/uL — ABNORMAL LOW (ref 4.22–5.81)
RDW: 23.1 % — ABNORMAL HIGH (ref 11.5–15.5)
WBC: 20.2 10*3/uL — ABNORMAL HIGH (ref 4.0–10.5)
nRBC: 0.3 % — ABNORMAL HIGH (ref 0.0–0.2)

## 2019-06-29 LAB — MAGNESIUM: Magnesium: 2 mg/dL (ref 1.7–2.4)

## 2019-06-29 LAB — PHOSPHORUS: Phosphorus: UNDETERMINED mg/dL (ref 2.5–4.6)

## 2019-06-29 MED ORDER — VITAL HIGH PROTEIN PO LIQD
1000.0000 mL | ORAL | Status: DC
  Administered 2019-06-29: 1000 mL

## 2019-06-29 NOTE — Progress Notes (Signed)
Ch visited pt family as they presented concerns regarding the pt. Pt's family presented to be under significant emotional distress upon ch arrival. Ch assisted with ensuring pt family received an update from the other members of the care team. Both wife and daughter were at bedside as they tearfully expressed the hard decision to extubate the pt later today. Ch allowed space for the family to express their struggles and lament regarding the transition of their loved one. Family expressed their concerns about other family members and the minister not being allowed to see the pt. Ch affirmed the pt's family on how challenging this could be during a very emotional time for their family. Ch provided words of encouragement and allowed family private time with the pt.    06/29/19 1100  Clinical Encounter Type  Visited With Patient and family together;Health care provider  Visit Type Follow-up;Psychological support;Spiritual support;Social support;Patient actively dying  Referral From Chaplain  Consult/Referral To Chaplain  Recommendations F/U to provide spritual and emotional support   Spiritual Encounters  Spiritual Needs Emotional;Grief support  Stress Factors  Patient Stress Factors Exhausted;Health changes;Major life changes  Family Stress Factors Exhausted;Loss;Loss of control;Major life changes

## 2019-06-29 NOTE — Progress Notes (Addendum)
PULMONARY/CCM PROGRESS NOTE  PT PROFILE: 85 M alcoholic with previously undiagnosed cirrhosis admitted via ED with brief out of hospital cardiac arrest after witnessed massive hematemesis.  In ED, profoundly hypotensive and received 6 units RBCs, 4 units FFP, 1 unit platelets on massive transfusion protocol.  He is a heavy drinker and takes large volumes of nonsteroidals.  MAJOR EVENTS/TEST RESULTS: 08/14 admission as documented above 08/14 gastroenterology consultation.  Deemed too unstable for EGD 08/15 RUQ [place]: Markedly diffusely echogenic liver. This could be due to steatosis, cirrhosis or chronic hepatitis. Small amount of ascites. Diffuse gallbladder wall thickening  08/15 nephrology consultation: CRRT initiated 08/15 intermittently agitated.  Low-dose midazolam infusion initiated 08/16 - 08/18 remains on midaz 2-4 mg/hr for ventilator synchrony.  Remains on vasopressors and CRRT.  Remains intubated, FiO2 60%.  Not following commands.  Withdraws from pain.  Family updated on daily basis and poor prognosis conveyed with little evidence of improvement.  08/19 No change overall. Remains comatose off midaz gtt. Remains on vasopressors.  Remains anuric on CRRT. 08/20 CRRT discontinued   INDWELLING DEVICES:: R femoral cordis 08/14 >> 08/15 ETT 08/14 >>  R IJ CVL 08/14 >>  L IJ HD cath 08/14 >>   MICRO DATA:  SARS-CoV-2 PCR 8/14 >> NEG  Blood 8/14 >> NEG Resp 08/17 >> abundant stenotrophomonas, moderate Serratia  ANTIMICROBIALS:  Ceftriaxone 08/14 >>08/20   PCT 08/17: 3.09  SUBJ: Off of all sedation.  Comatose.  Synchronous with ventilator.  Remains on CRRT.  Remains on vasopressors.  OBJ: Vitals:   06/29/19 0752 06/29/19 0800 06/29/19 0900 06/29/19 1000  BP:  (!) 85/57 91/60 (!) 87/58  Pulse:  77 81 80  Resp:  _0 Temp:  99.3 F (37.4 C) 99 F (37.2 C) 98.6 F (37 C)  TempSrc:  Bladder    SpO2: 95% (!) 85% 93% 94%  Weight:      Height:       Vent Mode: PCV FiO2  (%):  [55 %-60 %] 55 % Set Rate:  [12 bmp] 12 bmp Vt Set:  [500 mL] 500 mL PEEP:  [5 cmH20] 5 cmH20 Plateau Pressure:  [19 cmH20] 19 cmH20    Gen: intubated, RASS -4, not following commands, severe icterus HEENT: Severe sclericterus, NCAT Neck: No LAN, no JVD noted Lungs: Bilateral rhonchi, no wheezes anteriorly, + cough with ET suctioning Cardiovascular: Regular, no M Abdomen: Mildly distended, soft, NT, + BS Ext: Warm, symmetric ankle and pedal edema Neuro: PERRL, EOMI, no spontaneous movement, no withdrawal from pain Skin: Severe jaundice, extensive ecchymoses on BUE   BMP Latest Ref Rng & Units 06/29/2019 06/28/2019 06/28/2019  Glucose 70 - 99 mg/dL 153(H) 141(H) 146(H)  BUN 8 - 23 mg/dL 30(H) 29(H) 32(H)  Creatinine 0.61 - 1.24 mg/dL 0.90 0.79 0.94  BUN/Creat Ratio 9 - 20 - - -  Sodium 135 - 145 mmol/L 136 134(L) 135  Potassium 3.5 - 5.1 mmol/L 4.9 4.3 4.3  Chloride 98 - 111 mmol/L 103 103 103  CO2 22 - 32 mmol/L _1 Calcium 8.9 - 10.3 mg/dL 8.0(L) 7.7(L) 7.9(L)    Hepatic Function Latest Ref Rng & Units 06/29/2019 06/29/2019 06/28/2019  Total Protein 6.5 - 8.1 g/dL 5.4(L) - -  Albumin 3.5 - 5.0 g/dL 1.7(L) 1.7(L) 1.7(L)  AST 15 - 41 U/L 229(H) - -  ALT 0 - 44 U/L UNABLE TO REPORT DUE TO ICTERUS. QSD - -  Alk Phosphatase 38 - 126 U/L 175(H) - -  Total Bilirubin 0.3 - 1.2 mg/dL 26.7(HH) - -  Bilirubin, Direct 0.0 - 0.2 mg/dL 16.6(H) - -    CBC Latest Ref Rng & Units 06/29/2019 06/28/2019 06/27/2019  WBC 4.0 - 10.5 K/uL 20.2(H) 17.3(H) 12.7(H)  Hemoglobin 13.0 - 17.0 g/dL 13.9 14.4 14.0  Hematocrit 39.0 - 52.0 % 39.7 41.4 40.1  Platelets 150 - 400 K/uL 57(L) 56(L) 62(L)   ABG    Component Value Date/Time   PHART 7.47 (H) 06/24/2019 0451   PCO2ART 31 (L) 06/24/2019 0451   PO2ART 81 (L) 06/24/2019 0451   HCO3 22.6 06/24/2019 0451   ACIDBASEDEF 0.2 06/24/2019 0451   O2SAT 96.6 06/24/2019 0451     CXR: NSC to slightly improved low lung volumes, bibasilar  opacities, suspect atelectasis   IMPRESSION: 1) Admitted with massive UGIB, hemorrhagic shock, brief out of hosp cardiac arrest 2) Ventilator dependent respiratory failure. Intubated due to AMS/cardiac arrest 3) vasopressor dependent shock 4) anuric AKI  5) Metabolic acidosis, resolved on CRRT 6) Hyperkalemia, resolved on CRRT  7) Mild hyponatremia, resolved on CRRT 8) UGIB - variceal vs gastric (heavy alcohol abuse, heavy NSAIDs use) 9) Acute liver failure due to alcoholic hepatitis 10) underlying cirrhosis, likely alcoholic 11) Acute blood loss anemia - not overtly bleeding at present time 12) moderate thrombocytopenia 13) Coagulopathy due to liver failure 14) Acute encephalopathy, multifactorial - metabolic, alcohol withdrawal 15) history of heavy alcohol abuse prior to admission 16) ICU/ventilator associated discomfort  PLAN/REC: Cont vent support - settings reviewed and/or adjusted Cont vent bundle Daily SBT if/when meets criteria Continue ICU hemodynamic monitoring Wean norepinephrine to off for MAP >65 mmHg Monitor BMET intermittently Monitor I/Os Correct electrolytes as indicated CRRT discontinued 06/29/2019 Octreotide infusion discontinued 08/16 Continue TF protocol initiated 8/18 Discontinue SSI (not requiring) Monitor temp, WBC count Micro and abx as above DVT px: SCDs Monitor CBC intermittently Continue morphine gtt for pain management   -Pts family plan to transition pt to comfort measures only on 2019/07/22.  Continue current treatment plan as listed above until pt transitioned to comfort measures only  Marda Stalker, [place] Pager [phone] (please enter 7 digits) [place] Pager [phone] (please enter 7 digits)   PCCM ATTENDING ATTESTATION: I have evaluated patient with the APP [place], reviewed database in its entirety and discussed care plan in detail. In addition, this patient was discussed on multidisciplinary  rounds. I have personally reviewed all chest radiographs discussed herein including CXRs and CT chest unless otherwise indicated  I agree with the above findings, assessment and plan.  I have again updated patient's wife and daughter.  Plan is for terminal extubation 8/21.  We are working on getting his personal pastor and other family members here if allowed by hospital policy.   Merton Border, MD PCCM service Mobile [phone] Pager [phone] 06/29/2019 2:13 PM

## 2019-06-29 NOTE — Progress Notes (Signed)
[facility]  ROUNDING NOTE   Subjective:   On norepinephrine gtt  Family is moving towards comfort care  CRRT UF 1077m.  UOP 292m  Objective:  Vital signs in last 24 hours:  Temp:  [96.3 F (35.7 C)-100 F (37.8 C)] 96.6 F (35.9 C) (08/20 0000) Pulse Rate:  [73-93] 82 (08/20 0000) Resp:  [12-14] 12 (08/20 0000) BP: (73-99)/(55-70) 85/60 (08/20 0000) SpO2:  [91 %-96 %] 95 % (08/20 0752) FiO2 (%):  [55 %-60 %] 55 % (08/20 0752) Weight:  [87.7 kg] 87.7 kg (08/20 0500)  Weight change: 1.6 kg Filed Weights   06/27/19 0500 06/28/19 0500 06/29/19 0500  Weight: 88.6 kg 86.1 kg 87.7 kg    Intake/Output: I/O last 3 completed shifts: In: 1442.8 [I.V.:605.3; Other:200; NG/GT:537.5; IV Piggyback:100] Out: 1969 [Urine:39; Other:1629; Stool:301]   Intake/Output this shift:  Total I/O In: -  Out: 4754Other:47]  Physical Exam: General: Critically ill  Head: ETT  Eyes: +icterus  Neck: Supple, trachea midline  Lungs:   Pressure Control. FIO2 55%  Heart: Regular rate and rhythm  Abdomen:  Soft, nontender,   Extremities:  no peripheral edema.  Neurologic: Intubated and sedated  Skin: +jaundice  Access: Left IJ temp dialysis 8/0/96  Basic Metabolic Panel: Recent Labs  Lab 06/28/19 0540 06/28/19 1211 06/28/19 1809 06/28/19 2320 06/29/19 0517  NA 136 136 135 134* 136  K 4.4 4.6 4.3 4.3 4.9  CL 101 104 103 103 103  CO2 _0 GLUCOSE 124* 155* 146* 141* 153*  BUN 29* 34* 32* 29* 30*  CREATININE 0.83 1.05 0.94 0.79 0.90  CALCIUM 8.0* 7.8* 7.9* 7.7* 8.0*  MG 2.0 2.0 2.1 2.0 2.0  PHOS UNABLE TO REPORT DUE TO ICTERUS  2.9 3.5 3.2 3.0 UNABLE TO REPORT DUE TO ICTERUS. QSD  2.8    Liver Function Tests: Recent Labs  Lab 06/24/19 0500  06/25/19 0608  06/27/19 0547  06/28/19 0540 06/28/19 1211 06/28/19 1809 06/28/19 2320 06/29/19 0517  AST 257*  --  317*  --  268*  --  246*  --   --   --  229*  ALT NOT VALID  --  UNABLE TO REPORT DUE TO  ICTERUS  --  UNABLE TO REPORT DUE TO ICTERUS  --  UNABLE TO REPORT DUE TO ICTERUS  --   --   --  UNABLE TO REPORT DUE TO ICTERUS. QSD  ALKPHOS 164*  --  175*  --  207*  --  189*  --   --   --  175*  BILITOT 31.0*  --  31.1*  --  29.9*  --  29.5*  --   --   --  26.7*  PROT 5.4*  --  5.7*  --  5.7*  --  5.9*  --   --   --  5.4*  ALBUMIN 2.2*   < > 2.1*   < > 1.8*  1.9*   < > 1.9*  1.8* 1.8* 1.9* 1.7* 1.7*  1.7*   < > = values in this interval not displayed.   No results for input(s): LIPASE, AMYLASE in the last 168 hours. Recent Labs  Lab 06/22/2019 1715  AMMONIA 51*    CBC: Recent Labs  Lab 06/13/2019 1321  06/25/19 0608 06/26/19 0518 06/27/19 0547 06/28/19 0540 06/29/19 0517  WBC 17.0*   < > 11.9* 11.2* 12.7* 17.3* 20.2*  NEUTROABS 10.7*  --   --   --   --   --   --  HGB 7.1*   < > 14.1 14.0 14.0 14.4 13.9  HCT 20.5*   < > 38.7* 39.9 40.1 41.4 39.7  MCV 116.5*   < > 91.1 93.4 94.4 95.2 95.0  PLT 239   < > 96* 78* 62* 56* 57*   < > = values in this interval not displayed.    Cardiac Enzymes: No results for input(s): CKTOTAL, CKMB, CKMBINDEX, TROPONINI in the last 168 hours.  BNP: Invalid input(s): POCBNP  CBG: Recent Labs  Lab 06/28/19 1206 06/28/19 1557 06/28/19 1937 06/28/19 2359 06/29/19 0422  GLUCAP 125* 125* 131* 134* 140*    Microbiology: Results for orders placed or performed during the hospital encounter of 07/09/2019  Blood Culture (routine x 2)     Status: None   Collection Time: 06/28/2019 12:38 PM   Specimen: BLOOD  Result Value Ref Range Status   Specimen Description BLOOD LEFT ANTECUBITAL  Final   Special Requests   Final    BOTTLES DRAWN AEROBIC AND ANAEROBIC Blood Culture adequate volume   Culture   Final    NO GROWTH 5 DAYS Performed at [facility], [address] [place], [place] [address]    Report Status 06/28/2019 FINAL  Final  SARS Coronavirus 2 [facility] order, Performed in [facility] hospital lab) Nasopharyngeal  Nasopharyngeal Swab     Status: None   Collection Time: 06/19/2019 12:58 PM   Specimen: Nasopharyngeal Swab  Result Value Ref Range Status   SARS Coronavirus 2 NEGATIVE NEGATIVE Final    Comment: (NOTE) If result is NEGATIVE SARS-CoV-2 target nucleic acids are NOT DETECTED. The SARS-CoV-2 RNA is generally detectable in upper and lower  respiratory specimens during the acute phase of infection. The lowest  concentration of SARS-CoV-2 viral copies this assay can detect is 250  copies / mL. A negative result does not preclude SARS-CoV-2 infection  and should not be used as the sole basis for treatment or other  patient management decisions.  A negative result may occur with  improper specimen collection / handling, submission of specimen other  than nasopharyngeal swab, presence of viral mutation(s) within the  areas targeted by this assay, and inadequate number of viral copies  (<250 copies / mL). A negative result must be combined with clinical  observations, patient history, and epidemiological information. If result is POSITIVE SARS-CoV-2 target nucleic acids are DETECTED. The SARS-CoV-2 RNA is generally detectable in upper and lower  respiratory specimens dur ing the acute phase of infection.  Positive  results are indicative of active infection with SARS-CoV-2.  Clinical  correlation with patient history and other diagnostic information is  necessary to determine patient infection status.  Positive results do  not rule out bacterial infection or co-infection with other viruses. If result is PRESUMPTIVE POSTIVE SARS-CoV-2 nucleic acids MAY BE PRESENT.   A presumptive positive result was obtained on the submitted specimen  and confirmed on repeat testing.  While 2019 novel coronavirus  (SARS-CoV-2) nucleic acids may be present in the submitted sample  additional confirmatory testing may be necessary for epidemiological  and / or clinical management purposes  to differentiate between   SARS-CoV-2 and other Sarbecovirus currently known to infect humans.  If clinically indicated additional testing with an alternate test  methodology [phone]) is advised. The SARS-CoV-2 RNA is generally  detectable in upper and lower respiratory sp ecimens during the acute  phase of infection. The expected result is Negative. Fact Sheet for Patients:  StrictlyIdeas.no Fact Sheet for Healthcare Providers:  BankingDealers.co.za This test is not yet approved or cleared by the [place] and has been authorized for detection and/or diagnosis of SARS-CoV-2 by FDA under an Emergency Use Authorization (EUA).  This EUA will remain in effect (meaning this test can be used) for the duration of the COVID-19 declaration under Section 564(b)(1) of the Act, 21 U.S.C. section 360bbb-3(b)(1), unless the authorization is terminated or revoked sooner. Performed at [facility], [address] [place], [place] [address]   Blood Culture (routine x 2)     Status: None   Collection Time: 06/17/2019  6:59 PM   Specimen: BLOOD  Result Value Ref Range Status   Specimen Description BLOOD BLOOD RIGHT HAND  Final   Special Requests   Final    BOTTLES DRAWN AEROBIC AND ANAEROBIC Blood Culture results may not be optimal due to an inadequate volume of blood received in culture bottles   Culture   Final    NO GROWTH 5 DAYS Performed at [facility], [address] [place], [place] [address]    Report Status 06/28/2019 FINAL  Final  MRSA PCR Screening     Status: None   Collection Time: 06/26/19  9:13 AM   Specimen: Nasal Mucosa; Nasopharyngeal  Result Value Ref Range Status   MRSA by PCR NEGATIVE NEGATIVE Final    Comment:        The GeneXpert MRSA Assay (FDA approved for NASAL specimens only), is one component of a comprehensive MRSA colonization surveillance program. It is not intended to diagnose MRSA infection nor to guide  or monitor treatment for MRSA infections. Performed at [facility], [address] [place], [place] [address]   Culture, respiratory     Status: None (Preliminary result)   Collection Time: 06/26/19 11:52 AM   Specimen: Tracheal Aspirate; Respiratory  Result Value Ref Range Status   Specimen Description TRACHEAL ASPIRATE  Final   Special Requests NONE  Final   Gram Stain   Final    ABUNDANT WBC PRESENT,BOTH PMN AND MONONUCLEAR ABUNDANT GRAM NEGATIVE RODS MODERATE GRAM POSITIVE COCCI ABUNDANT YEAST    Culture   Final    ABUNDANT STENOTROPHOMONAS MALTOPHILIA MODERATE SERRATIA MARCESCENS SUSCEPTIBILITIES TO FOLLOW    Report Status PENDING  Incomplete   Organism ID, Bacteria STENOTROPHOMONAS MALTOPHILIA  Final      Susceptibility   Stenotrophomonas maltophilia - MIC*    LEVOFLOXACIN 0.5 SENSITIVE Sensitive     TRIMETH/SULFA Value in next row Sensitive      <=20 SENSITIVEPerformed at [facility], [address] [place], [place] [address]    * ABUNDANT STENOTROPHOMONAS MALTOPHILIA    Coagulation Studies: Recent Labs    06/27/19 0547 06/28/19 0540  LABPROT 16.4* 16.7*  INR 1.3* 1.4*    Urinalysis: No results for input(s): COLORURINE, LABSPEC, PHURINE, GLUCOSEU, HGBUR, BILIRUBINUR, KETONESUR, PROTEINUR, UROBILINOGEN, NITRITE, LEUKOCYTESUR in the last 72 hours.  Invalid input(s): APPERANCEUR    Imaging: Dg Chest Port 1 View  Result Date: 06/28/2019 CLINICAL DATA:  Respiratory failure. EXAM: PORTABLE CHEST 1 VIEW COMPARISON:  One-view chest x-ray 06/27/2019. FINDINGS: Endotracheal tube is stable, 3.3 cm above the carina. Side port of the NG tube is in the fundus of the stomach. Bilateral IJ lines are stable. Lung volumes are low. Interstitial and airspace disease is again seen, predominantly in the lower lobes. No definite effusions are present. Aeration is slightly improved. IMPRESSION: 1. Slight improvement of interstitial and airspace disease in the  lower lobes bilaterally. Differential  diagnosis includes edema or infection. No significant consolidation present. 2. Support apparatus is stable. 3. Persistent low lung volumes. Electronically Signed   By: San Morelle M.D.   On: 06/28/2019 06:25     Medications:   . sodium chloride Stopped (06/27/19 2004)  . cefTRIAXone (ROCEPHIN)  IV 1 g (06/28/19 1100)  . feeding supplement (VITAL HIGH PROTEIN) 1,000 mL (06/28/19 1639)  . morphine 5 mg/hr (06/29/19 0515)  . norepinephrine (LEVOPHED) Adult infusion 15 mcg/min (06/29/19 0508)  . pureflow 500 each (06/29/19 9167)   . B-complex with vitamin C  1 tablet Per Tube QHS  . chlorhexidine gluconate (MEDLINE KIT)  15 mL Mouth Rinse BID  . Chlorhexidine Gluconate Cloth  6 each Topical Q0600  . feeding supplement (PRO-STAT SUGAR FREE 64)  30 mL Per Tube Daily  . hydrocortisone sod succinate (SOLU-CORTEF) inj  50 mg Intravenous Q12H  . mouth rinse  15 mL Mouth Rinse 10 times per day  . pantoprazole  40 mg Intravenous Q12H  . sodium chloride flush  10-40 mL Intracatheter Q12H  . sodium chloride flush  3 mL Intravenous Q12H   sodium chloride, albuterol, fentaNYL (SUBLIMAZE) injection, heparin, midazolam, [DISCONTINUED] ondansetron **OR** ondansetron (ZOFRAN) IV, sodium chloride flush  Assessment/ Plan:  Mr. ELIBERTO SOLE is a 63 y.o. white male with alcohol abuse, psoriasis, tubular adenoma of the colon,tobacco abusewho was admitted to [facility] on 8/14/2020for evaluation of severe GI bleeding and cardiac arrest. Started CRRT on 8/15 for acute renal failure  1. Acute renal failure with metabolic acidosis secondary to hypovolemic shock. Baseline creatinine of 0.95, normal GFR on 04/14/2016.  Anuric urine output. Requiring renal replacement therapy - Continue CRRT for now.   2. Hypovolemic shock/hypotension due to GI bleed. Requiring vasopressors: norepinephrine  Weaned off vasopressin  3. Acute respiratory failure: requiring mechanical  ventilation  4. Multiorgan failure with elevated MELD score. Overall prognosis is poor.    LOS: 6 Shaneta Cervenka 8/20/20208:20 AM

## 2019-06-30 DIAGNOSIS — K7211 Chronic hepatic failure with coma: Secondary | ICD-10-CM

## 2019-06-30 LAB — CULTURE, RESPIRATORY W GRAM STAIN

## 2019-06-30 MED ORDER — CHLORHEXIDINE GLUCONATE 0.12 % MT SOLN
OROMUCOSAL | Status: AC
  Administered 2019-06-30: 08:00:00
  Filled 2019-06-30: qty 15

## 2019-06-30 MED ORDER — GLYCOPYRROLATE 0.2 MG/ML IJ SOLN
0.2000 mg | INTRAMUSCULAR | Status: DC | PRN
  Administered 2019-06-30: 13:00:00 0.2 mg via INTRAVENOUS
  Filled 2019-06-30 (×2): qty 1

## 2019-06-30 MED ORDER — GLYCOPYRROLATE 0.2 MG/ML IJ SOLN
0.2000 mg | INTRAMUSCULAR | Status: DC | PRN
  Administered 2019-06-30: 13:00:00 0.2 mg via INTRAVENOUS

## 2019-06-30 MED ORDER — MORPHINE BOLUS VIA INFUSION
1.0000 mg | INTRAVENOUS | Status: DC | PRN
  Filled 2019-06-30: qty 2

## 2019-06-30 MED ORDER — LORAZEPAM 2 MG/ML IJ SOLN
2.0000 mg | INTRAMUSCULAR | Status: DC | PRN
  Administered 2019-06-30: 13:00:00 1 mg via INTRAVENOUS
  Administered 2019-06-30: 13:00:00 2 mg via INTRAVENOUS
  Filled 2019-06-30: qty 1

## 2019-06-30 MED ORDER — LORAZEPAM 2 MG/ML IJ SOLN
1.0000 mg | INTRAMUSCULAR | Status: DC | PRN
  Administered 2019-06-30: 1 mg via INTRAVENOUS
  Filled 2019-06-30: qty 1

## 2019-07-03 ENCOUNTER — Telehealth: Payer: Self-pay | Admitting: Pulmonary Disease

## 2019-07-03 NOTE — Telephone Encounter (Signed)
Death certificate has bene placed in LG's folder.

## 2019-07-05 NOTE — Telephone Encounter (Signed)
Death certificate has been completed and placed up front for pick up. Heil is aware.

## 2019-07-11 NOTE — Death Summary Note (Signed)
DEATH SUMMARY   Patient Details  Name: Trevor Lopez MRN: [id number] DOB: 22-Sep-1956  Admission/Discharge Information   Admit Date:  07-07-19  Date of Death: Date of Death: 07-14-2019  Time of Death: Time of Death: February 15, 1326  Length of Stay: 7  Referring Physician: Guadalupe Maple, MD   Reason(s) for Hospitalization  Acute Gastrointestinal Bleeding   Diagnoses  Preliminary cause of death: Cirrhosis of liver ([place]) Secondary Diagnoses (including complications and co-morbidities):  Active Problems:   Upper GI bleed   Acute GI bleeding   Hemorrhagic shock   Cardiac arrest    Metabolic acidosis   Anuric acute renal failure with hyperkalemia    Acute blood loss anemia   ETOH Abuse   Acute encephalopathy likely secondary to metabolic derangements and ETOH withdrawal   Brief Hospital Course (including significant findings, care, treatment, and services provided and events leading to death)  Trevor Lopez is a 63 y.o. year old male who presented to [facility] ER on 2023/07/07 via EMS following brief cardiac arrest secondary to hemorrhagic shock in the setting of massive acute GI bleed secondary to ETOH abuse and NSAID/Goody Powder use.  He required mechanical intubation and multiple blood product transfusions on arrival to the ER.  He was subsequently admitted to ICU for additional workup and treatment.  GI consulted on admission and pt deemed too unstable for endoscopic procedures recommended treating conservatively with blood products, octreotide, and a PPI.  H&H remained stable following multiple blood product transfusions at admission.  Lab results also revealed acute renal failure and pt anuric secondary to hemorrhagic shock requiring initiation of CRRT on 07/07/19 per nephrology recommendations.  Despite treatment unable to liberate pt from mechanical ventilation due to high O2 requirements, pt remained on levophed gtt due to continued hypotension, and he remained encephalopathic (pt unable to follow  commands).  Pts family decided to transition pt to comfort care measures only on 07-14-19.  Pt expired on 07/14/19 at 63 with family at bedside.   Pertinent Labs and Studies  Significant Diagnostic Studies Dg Abd 1 View  Result Date: 07-Jul-2019 CLINICAL DATA:  Orogastric tube placement. EXAM: ABDOMEN - 1 VIEW COMPARISON:  Earlier today. FINDINGS: Interval orogastric tube with its tip in the proximal to mid stomach and side hole in the proximal stomach. Normal bowel gas pattern. Mild thoracolumbar spine degenerative changes. IMPRESSION: Orogastric tube tip in the proximal to mid stomach and side hole in the proximal stomach. Electronically Signed   By: Claudie Revering M.D.   On: 07/07/19 17:57   Dg Abdomen 1 View  Result Date: 07-Jul-2019 CLINICAL DATA:  Check Blakemore catheter placement EXAM: ABDOMEN - 1 VIEW COMPARISON:  None. FINDINGS: Scattered large and small bowel gas is noted. catheter is noted within the mid to distal esophagus. The tip is 8 cm from the stomach. IMPRESSION: Blakemore catheter is noted in the mid to distal esophagus. Advancement is recommended. Electronically Signed   By: Inez Catalina M.D.   On: 2019/07/07 15:45   Dg Chest Port 1 View  Result Date: 06/28/2019 CLINICAL DATA:  Respiratory failure. EXAM: PORTABLE CHEST 1 VIEW COMPARISON:  One-view chest x-ray 06/27/2019. FINDINGS: Endotracheal tube is stable, 3.3 cm above the carina. Side port of the NG tube is in the fundus of the stomach. Bilateral IJ lines are stable. Lung volumes are low. Interstitial and airspace disease is again seen, predominantly in the lower lobes. No definite effusions are present. Aeration is slightly improved. IMPRESSION: 1. Slight improvement of interstitial  and airspace disease in the lower lobes bilaterally. Differential diagnosis includes edema or infection. No significant consolidation present. 2. Support apparatus is stable. 3. Persistent low lung volumes. Electronically Signed   By:  San Morelle M.D.   On: 06/28/2019 06:25   Dg Chest Port 1 View  Result Date: 06/27/2019 CLINICAL DATA:  Respiratory failure EXAM: PORTABLE CHEST 1 VIEW COMPARISON:  06/26/2019 FINDINGS: Cardiac shadow is stable. Endotracheal tube and nasogastric catheter are noted and stable. Bilateral jugular catheters are again seen and stable. The overall inspiratory effort is again poor with crowding of the parenchymal markings. Previously seen bibasilar atelectatic changes are again noted. No new focal abnormality is noted. IMPRESSION: Stable bibasilar atelectasis. Tubes and lines as described. Electronically Signed   By: Inez Catalina M.D.   On: 06/27/2019 07:06   Dg Chest Port 1 View  Result Date: 06/26/2019 CLINICAL DATA:  Respiratory failure EXAM: PORTABLE CHEST 1 VIEW COMPARISON:  06/25/2019 FINDINGS: Endotracheal tube, nasogastric catheter, right jugular and left jugular central lines are again seen and stable. The overall inspiratory effort is poor with crowding of the vascular markings and previously seen basilar atelectasis. No sizable effusion is noted. IMPRESSION: Tubes and lines stable in appearance. Poor inspiratory effort with crowding of vascular markings and previously seen bibasilar atelectasis. Electronically Signed   By: Inez Catalina M.D.   On: 06/26/2019 08:07   Dg Chest Port 1 View  Result Date: 06/25/2019 CLINICAL DATA:  Elevated liver function tests EXAM: PORTABLE CHEST 1 VIEW COMPARISON:  Yesterday FINDINGS: Endotracheal tube tip at the clavicular heads. Bilateral IJ line with tips at the SVC. The orogastric tube reaches the stomach at least. Low volume chest with interstitial crowding/atelectasis. Stable heart size. No pneumothorax. IMPRESSION: Stable hardware positioning and low volume chest with basilar opacification. Electronically Signed   By: Monte Fantasia M.D.   On: 06/25/2019 07:44   Dg Chest Port 1 View  Result Date: 06/24/2019 CLINICAL DATA:  Respiratory failure. EXAM:  PORTABLE CHEST 1 VIEW COMPARISON:  06/27/2019 FINDINGS: There is a right IJ catheter with tip projecting over the SVC. Left IJ catheter is also noted with tip projecting over the SVC. Enteric tube is in place with side port below GE junction. The ET tube tip is above the carina. Normal heart size. Pulmonary vascular congestion is similar to previous exam. IMPRESSION: 1. Pulmonary vascular congestion, similar. 2. Support apparatus positioned as above. Electronically Signed   By: Kerby Moors M.D.   On: 06/24/2019 08:54   Dg Chest Port 1 View  Result Date: 06/25/2019 CLINICAL DATA:  Central line placement. EXAM: PORTABLE CHEST 1 VIEW COMPARISON:  Chest x-ray from same day at 1:27 p.m. FINDINGS: New left internal jugular central venous catheter with the tip in the distal SVC. New right internal jugular central venous catheter with the tip in the mid SVC. Unchanged endotracheal tube with the tip 5.2 cm above the carina. New Blakemore tube with the tip in the distal esophagus. The heart size and mediastinal contours are within normal limits. Normal pulmonary vascularity. Low lung volumes with mild bibasilar atelectasis. No focal consolidation, pleural effusion, or pneumothorax. No acute osseous abnormality. IMPRESSION: 1. New bilateral internal jugular central venous catheters without complicating feature. 2. New Blakemore tube with the tip in the distal esophagus. Electronically Signed   By: Titus Dubin M.D.   On: 06/15/2019 17:59   Dg Chest Port 1 View  Result Date: 06/22/2019 CLINICAL DATA:  Hypoxia EXAM: PORTABLE CHEST 1 VIEW COMPARISON:  None. FINDINGS: Endotracheal tube tip is 3.4 cm above the carina. Nasogastric tube tip and side port are below the diaphragm. No pneumothorax. There is no evident edema or consolidation. Heart size and pulmonary vascularity are normal. No adenopathy. No bone lesions. IMPRESSION: Tube positions as described without pneumothorax. No edema or consolidation. Cardiac  silhouette within normal limits. Electronically Signed   By: Lowella Grip III M.D.   On: 07/04/2019 13:45   US Abdomen Limited Ruq  Result Date: 06/24/2019 CLINICAL DATA:  Elevated liver function tests. EXAM: ULTRASOUND ABDOMEN LIMITED RIGHT UPPER QUADRANT COMPARISON:  None. FINDINGS: Gallbladder: Mild diffuse wall thickening with a maximum thickness of 5 mm. No visible gallstones or pericholecystic fluid. No sonographic Murphy sign. Common bile duct: Diameter: 5.1 mm, poorly visualized. Liver: Markedly diffusely echogenic. No visible mass. The portal vein cannot be adequately visualized to assess flow. Other: Small amount of free peritoneal fluid. IMPRESSION: 1. Markedly diffusely echogenic liver. This could be due to steatosis, cirrhosis or chronic hepatitis. 2. Small amount of ascites. 3. Diffuse gallbladder wall thickening. In the absence of visualized gallstones or a sonographic Murphy sign, this may be due to hypoproteinemia, acute hepatitis or chronic acalculous cholecystitis. Electronically Signed   By: Claudie Revering M.D.   On: 06/24/2019 13:34    Microbiology Recent Results (from the past 240 hour(s))  Blood Culture (routine x 2)     Status: None   Collection Time: 06/12/2019 12:38 PM   Specimen: BLOOD  Result Value Ref Range Status   Specimen Description BLOOD LEFT ANTECUBITAL  Final   Special Requests   Final    BOTTLES DRAWN AEROBIC AND ANAEROBIC Blood Culture adequate volume   Culture   Final    NO GROWTH 5 DAYS Performed at [facility], [address], [place], [place] [address]    Report Status 06/28/2019 FINAL  Final  SARS Coronavirus 2 [facility] order, Performed in [facility] hospital lab) Nasopharyngeal Nasopharyngeal Swab     Status: None   Collection Time: 07/02/2019 12:58 PM   Specimen: Nasopharyngeal Swab  Result Value Ref Range Status   SARS Coronavirus 2 NEGATIVE NEGATIVE Final    Comment: (NOTE) If result is NEGATIVE SARS-CoV-2 target nucleic acids are  NOT DETECTED. The SARS-CoV-2 RNA is generally detectable in upper and lower  respiratory specimens during the acute phase of infection. The lowest  concentration of SARS-CoV-2 viral copies this assay can detect is 250  copies / mL. A negative result does not preclude SARS-CoV-2 infection  and should not be used as the sole basis for treatment or other  patient management decisions.  A negative result may occur with  improper specimen collection / handling, submission of specimen other  than nasopharyngeal swab, presence of viral mutation(s) within the  areas targeted by this assay, and inadequate number of viral copies  (<250 copies / mL). A negative result must be combined with clinical  observations, patient history, and epidemiological information. If result is POSITIVE SARS-CoV-2 target nucleic acids are DETECTED. The SARS-CoV-2 RNA is generally detectable in upper and lower  respiratory specimens dur ing the acute phase of infection.  Positive  results are indicative of active infection with SARS-CoV-2.  Clinical  correlation with patient history and other diagnostic information is  necessary to determine patient infection status.  Positive results do  not rule out bacterial infection or co-infection with other viruses. If result is PRESUMPTIVE POSTIVE SARS-CoV-2 nucleic acids MAY BE PRESENT.   A presumptive positive result was  obtained on the submitted specimen  and confirmed on repeat testing.  While 2019 novel coronavirus  (SARS-CoV-2) nucleic acids may be present in the submitted sample  additional confirmatory testing may be necessary for epidemiological  and / or clinical management purposes  to differentiate between  SARS-CoV-2 and other Sarbecovirus currently known to infect humans.  If clinically indicated additional testing with an alternate test  methodology [phone]) is advised. The SARS-CoV-2 RNA is generally  detectable in upper and lower respiratory sp ecimens  during the acute  phase of infection. The expected result is Negative. Fact Sheet for Patients:  StrictlyIdeas.no Fact Sheet for Healthcare Providers: BankingDealers.co.za This test is not yet approved or cleared by the [place] FDA and has been authorized for detection and/or diagnosis of SARS-CoV-2 by FDA under an Emergency Use Authorization (EUA).  This EUA will remain in effect (meaning this test can be used) for the duration of the COVID-19 declaration under Section 564(b)(1) of the Act, 21 U.S.C. section 360bbb-3(b)(1), unless the authorization is terminated or revoked sooner. Performed at [facility], [address] [place], [place] [address]   Blood Culture (routine x 2)     Status: None   Collection Time: 06/13/2019  6:59 PM   Specimen: BLOOD  Result Value Ref Range Status   Specimen Description BLOOD BLOOD RIGHT HAND  Final   Special Requests   Final    BOTTLES DRAWN AEROBIC AND ANAEROBIC Blood Culture results may not be optimal due to an inadequate volume of blood received in culture bottles   Culture   Final    NO GROWTH 5 DAYS Performed at [facility], [address] [place], [place] [address]    Report Status 06/28/2019 FINAL  Final  MRSA PCR Screening     Status: None   Collection Time: 06/26/19  9:13 AM   Specimen: Nasal Mucosa; Nasopharyngeal  Result Value Ref Range Status   MRSA by PCR NEGATIVE NEGATIVE Final    Comment:        The GeneXpert MRSA Assay (FDA approved for NASAL specimens only), is one component of a comprehensive MRSA colonization surveillance program. It is not intended to diagnose MRSA infection nor to guide or monitor treatment for MRSA infections. Performed at [facility], [address] [place], [place] [address]   Culture, respiratory     Status: None   Collection Time: 06/26/19 11:52 AM   Specimen: Tracheal Aspirate; Respiratory  Result Value Ref  Range Status   Specimen Description   Final    TRACHEAL ASPIRATE Performed at [facility], [address] [place], [place] [address]    Special Requests   Final    NONE Performed at [facility], [address] [place], [place] [address]    Gram Stain   Final    ABUNDANT WBC PRESENT,BOTH PMN AND MONONUCLEAR ABUNDANT GRAM NEGATIVE RODS MODERATE GRAM POSITIVE COCCI ABUNDANT YEAST Performed at [facility], [place] [address], [place], [place] [address]    Culture   Final    ABUNDANT STENOTROPHOMONAS MALTOPHILIA MODERATE SERRATIA MARCESCENS    Report Status 2019-07-24 FINAL  Final   Organism ID, Bacteria STENOTROPHOMONAS MALTOPHILIA  Final   Organism ID, Bacteria SERRATIA MARCESCENS  Final      Susceptibility   Stenotrophomonas maltophilia - MIC*    LEVOFLOXACIN 0.5 SENSITIVE Sensitive     TRIMETH/SULFA <=20 SENSITIVE Sensitive     * ABUNDANT STENOTROPHOMONAS MALTOPHILIA   Serratia marcescens - MIC*  CEFAZOLIN >=64 RESISTANT Resistant     CEFEPIME <=1 SENSITIVE Sensitive     CEFTAZIDIME <=1 SENSITIVE Sensitive     CEFTRIAXONE <=1 SENSITIVE Sensitive     CIPROFLOXACIN <=0.25 SENSITIVE Sensitive     GENTAMICIN <=1 SENSITIVE Sensitive     TRIMETH/SULFA <=20 SENSITIVE Sensitive     * MODERATE SERRATIA MARCESCENS    Lab Basic Metabolic Panel: Recent Labs  Lab 06/28/19 0540 06/28/19 1211 06/28/19 1809 06/28/19 2320 06/29/19 0517  NA 136 136 135 134* 136  K 4.4 4.6 4.3 4.3 4.9  CL 101 104 103 103 103  CO2 24 23 24 24 25   GLUCOSE 124* 155* 146* 141* 153*  BUN 29* 34* 32* 29* 30*  CREATININE 0.83 1.05 0.94 0.79 0.90  CALCIUM 8.0* 7.8* 7.9* 7.7* 8.0*  MG 2.0 2.0 2.1 2.0 2.0  PHOS UNABLE TO REPORT DUE TO ICTERUS  2.9 3.5 3.2 3.0 UNABLE TO REPORT DUE TO ICTERUS. QSD  2.8   Liver Function Tests: Recent Labs  Lab 06/24/19 0500  06/25/19 0608  06/27/19 0547  06/28/19 0540 06/28/19 1211 06/28/19 1809 06/28/19 2320 06/29/19 0517  AST 257*   --  317*  --  268*  --  246*  --   --   --  229*  ALT NOT VALID  --  UNABLE TO REPORT DUE TO ICTERUS  --  UNABLE TO REPORT DUE TO ICTERUS  --  UNABLE TO REPORT DUE TO ICTERUS  --   --   --  UNABLE TO REPORT DUE TO ICTERUS. QSD  ALKPHOS 164*  --  175*  --  207*  --  189*  --   --   --  175*  BILITOT 31.0*  --  31.1*  --  29.9*  --  29.5*  --   --   --  26.7*  PROT 5.4*  --  5.7*  --  5.7*  --  5.9*  --   --   --  5.4*  ALBUMIN 2.2*   < > 2.1*   < > 1.8*  1.9*   < > 1.9*  1.8* 1.8* 1.9* 1.7* 1.7*  1.7*   < > = values in this interval not displayed.   No results for input(s): LIPASE, AMYLASE in the last 168 hours. Recent Labs  Lab 06/20/2019 1715  AMMONIA 51*   CBC: Recent Labs  Lab 06/25/19 0608 06/26/19 0518 06/27/19 0547 06/28/19 0540 06/29/19 0517  WBC 11.9* 11.2* 12.7* 17.3* 20.2*  HGB 14.1 14.0 14.0 14.4 13.9  HCT 38.7* 39.9 40.1 41.4 39.7  MCV 91.1 93.4 94.4 95.2 95.0  PLT 96* 78* 62* 56* 57*   Cardiac Enzymes: No results for input(s): CKTOTAL, CKMB, CKMBINDEX, TROPONINI in the last 168 hours. Sepsis Labs: Recent Labs  Lab 06/26/19 0518 06/26/19 0939 06/27/19 0547 06/28/19 0540 06/29/19 0517  PROCALCITON  --  3.09  --   --   --   WBC 11.2*  --  12.7* 17.3* 20.2*    Procedures/Operations  Mechanical Intubation  Right internal jugular CVL placement  Left internal jugular temporary trialysis catheter placement  Continuous renal replacement therapy  Marda Stalker, [place] Pager [phone] (please enter 7 digits) PCCM Consult Pager [phone] (please enter 7 digits)

## 2019-07-11 NOTE — Progress Notes (Signed)
PULMONARY/CCM PROGRESS NOTE  PT PROFILE: 25 M alcoholic with previously undiagnosed cirrhosis admitted via ED with brief out of hospital cardiac arrest after witnessed massive hematemesis.  In ED, profoundly hypotensive and received 6 units RBCs, 4 units FFP, 1 unit platelets on massive transfusion protocol.  He is a heavy drinker and takes large volumes of nonsteroidals.  MAJOR EVENTS/TEST RESULTS: 08/14 admission as documented above 08/14 gastroenterology consultation.  Deemed too unstable for EGD 08/15 RUQ [place]: Markedly diffusely echogenic liver. This could be due to steatosis, cirrhosis or chronic hepatitis. Small amount of ascites. Diffuse gallbladder wall thickening  08/15 nephrology consultation: CRRT initiated 08/15 intermittently agitated.  Low-dose midazolam infusion initiated 08/16 - 08/18 remains on midaz 2-4 mg/hr for ventilator synchrony.  Remains on vasopressors and CRRT.  Remains intubated, FiO2 60%.  Not following commands.  Withdraws from pain.  Family updated on daily basis and poor prognosis conveyed with little evidence of improvement.  08/19 No change overall. Remains comatose off midaz gtt. Remains on vasopressors.  Remains anuric on CRRT. 08/20 CRRT discontinued   INDWELLING DEVICES:: R femoral cordis 08/14 >> 08/15 ETT 08/14 >>  R IJ CVL 08/14 >>  L IJ HD cath 08/14 >>   MICRO DATA:  SARS-CoV-2 PCR 8/14 >> NEG  Blood 8/14 >> NEG Resp 08/17 >> abundant stenotrophomonas, moderate Serratia  ANTIMICROBIALS:  Ceftriaxone 08/14 >>08/20   PCT 08/17: 3.09  SUBJ: Off of all sedation.  Comatose.  Synchronous with ventilator. Currently requiring 25 mcg/min of levophed to maintain map 65 or higher. According to RN pt vomiting tube feeds this morning, therefore tube feedings currently on hold  OBJ: Vitals:   July 29, 2019 0645 Jul 29, 2019 0700 07-29-19 0800 07-29-2019 0805  BP: (!) 88/54 (!) 89/52 (!) 87/47   Pulse: 87 87 92   Resp: _0 Temp: (!) 100.6 F (38.1 C) (!)  100.6 F (38.1 C)    TempSrc:      SpO2: (!) 87% 93% 93% 93%  Weight:      Height:       Vent Mode: PCV FiO2 (%):  [55 %] 55 % Set Rate:  [12 bmp] 12 bmp PEEP:  [5 cmH20] 5 cmH20 Plateau Pressure:  [19 cmH20] 19 cmH20    Gen: intubated, RASS -4, not following commands, severe icterus HEENT: Severe sclericterus, NCAT Neck: No LAN, no JVD noted Lungs: Bilateral rhonchi throughout, even, non labored, absent gag reflex  Cardiovascular: NSR, rrr, no M/R/G Abdomen: Mildly distended, soft, NT, + BS Ext: Warm, symmetric ankle and pedal edema Neuro: PERRL, EOMI, no spontaneous movement, no withdrawal from pain Skin: Severe jaundice, extensive ecchymoses on BUE   BMP Latest Ref Rng & Units 06/29/2019 06/28/2019 06/28/2019  Glucose 70 - 99 mg/dL 153(H) 141(H) 146(H)  BUN 8 - 23 mg/dL 30(H) 29(H) 32(H)  Creatinine 0.61 - 1.24 mg/dL 0.90 0.79 0.94  BUN/Creat Ratio 9 - 20 - - -  Sodium 135 - 145 mmol/L 136 134(L) 135  Potassium 3.5 - 5.1 mmol/L 4.9 4.3 4.3  Chloride 98 - 111 mmol/L 103 103 103  CO2 22 - 32 mmol/L _1 Calcium 8.9 - 10.3 mg/dL 8.0(L) 7.7(L) 7.9(L)    Hepatic Function Latest Ref Rng & Units 06/29/2019 06/29/2019 06/28/2019  Total Protein 6.5 - 8.1 g/dL 5.4(L) - -  Albumin 3.5 - 5.0 g/dL 1.7(L) 1.7(L) 1.7(L)  AST 15 - 41 U/L 229(H) - -  ALT 0 - 44 U/L UNABLE TO REPORT DUE TO ICTERUS.  QSD - -  Alk Phosphatase 38 - 126 U/L 175(H) - -  Total Bilirubin 0.3 - 1.2 mg/dL 26.7(HH) - -  Bilirubin, Direct 0.0 - 0.2 mg/dL 16.6(H) - -    CBC Latest Ref Rng & Units 06/29/2019 06/28/2019 06/27/2019  WBC 4.0 - 10.5 K/uL 20.2(H) 17.3(H) 12.7(H)  Hemoglobin 13.0 - 17.0 g/dL 13.9 14.4 14.0  Hematocrit 39.0 - 52.0 % 39.7 41.4 40.1  Platelets 150 - 400 K/uL 57(L) 56(L) 62(L)   ABG    Component Value Date/Time   PHART 7.47 (H) 06/24/2019 0451   PCO2ART 31 (L) 06/24/2019 0451   PO2ART 81 (L) 06/24/2019 0451   HCO3 22.6 06/24/2019 0451   ACIDBASEDEF 0.2 06/24/2019 0451   O2SAT 96.6  06/24/2019 0451     CXR: NSC to slightly improved low lung volumes, bibasilar opacities, suspect atelectasis   IMPRESSION: 1) Admitted with massive UGIB, hemorrhagic shock, brief out of hosp cardiac arrest 2) Ventilator dependent respiratory failure. Intubated due to AMS/cardiac arrest 3) vasopressor dependent shock 4) anuric AKI  5) Metabolic acidosis, resolved on CRRT 6) Hyperkalemia, resolved on CRRT  7) Mild hyponatremia, resolved on CRRT 8) UGIB - variceal vs gastric (heavy alcohol abuse, heavy NSAIDs use) 9) Acute liver failure due to alcoholic hepatitis 10) underlying cirrhosis, likely alcoholic 11) Acute blood loss anemia - not overtly bleeding at present time 12) moderate thrombocytopenia 13) Coagulopathy due to liver failure 14) Acute encephalopathy, multifactorial - metabolic, alcohol withdrawal 15) history of heavy alcohol abuse prior to admission 16) ICU/ventilator associated discomfort  PLAN/REC: Cont vent support - settings reviewed and/or adjusted Cont vent bundle Daily SBT if/when meets criteria Continue ICU hemodynamic monitoring Wean norepinephrine to off for MAP >65 mmHg Monitor BMET intermittently Monitor I/Os Correct electrolytes as indicated CRRT discontinued 06/29/2019 Octreotide infusion discontinued 08/16 Hold tube feeds for now Not requiring insulin CBG's wnl  Monitor temp, WBC count Micro and abx as above DVT px: SCDs Monitor CBC intermittently Continue morphine gtt for pain management    -Pts family transitioning pt to comfort measures only today, will continue current plan of care until family arrives and ready to proceed with comfort measures only.  Marda Stalker, [place] Pager [phone] (please enter 7 digits) PCCM Consult Pager [phone] (please enter 7 digits)

## 2019-07-11 NOTE — Progress Notes (Signed)
Pt terminally extubated to RA per family request morphine gtt currently infusing for pt comfort.  Family currently at bedside and updated regarding plan of care and all questions were answered.  Marda Stalker, [place] Pager [phone] (please enter 7 digits) PCCM Consult Pager [phone] (please enter 7 digits)

## 2019-07-11 NOTE — Progress Notes (Signed)
[facility]  ROUNDING NOTE   Subjective:   On norepinephrine gtt  Family is moving towards comfort care  CRRT stopped this morning   Objective:  Vital signs in last 24 hours:  Temp:  [98.2 F (36.8 C)-101.5 F (38.6 C)] 100.6 F (38.1 C) (08/21 0700) Pulse Rate:  [76-102] 102 (08/21 0900) Resp:  [12-14] 12 (08/21 0900) BP: (73-98)/(43-64) 79/54 (08/21 0900) SpO2:  [82 %-95 %] 91 % (08/21 0900) FiO2 (%):  [55 %] 55 % (08/21 0805) Weight:  [87.7 kg] 87.7 kg (08/21 1000)  Weight change:  Filed Weights   06/28/19 0500 06/29/19 0500 2019-07-30 1000  Weight: 86.1 kg 87.7 kg 87.7 kg    Intake/Output: I/O last 3 completed shifts: In: 951.1 [I.V.:661.1; Other:200; IV Piggyback:90] Out: 725 [Urine:25; Other:716]   Intake/Output this shift:  Total I/O In: 90 [I.V.:10; Other:60; NG/GT:20] Out: -   Physical Exam: General: Critically ill  Head: ETT  Eyes: +icterus  Neck: Supple, trachea midline  Lungs:   Pressure Control. FIO2 55%  Heart: Regular rate and rhythm  Abdomen:  Soft, nontender,   Extremities:  no peripheral edema.  Neurologic: Intubated and sedated  Skin: +jaundice  Access: Left IJ temp dialysis 3/66    Basic Metabolic Panel: Recent Labs  Lab 06/28/19 0540 06/28/19 1211 06/28/19 1809 06/28/19 2320 06/29/19 0517  NA 136 136 135 134* 136  K 4.4 4.6 4.3 4.3 4.9  CL 101 104 103 103 103  CO2 _0 GLUCOSE 124* 155* 146* 141* 153*  BUN 29* 34* 32* 29* 30*  CREATININE 0.83 1.05 0.94 0.79 0.90  CALCIUM 8.0* 7.8* 7.9* 7.7* 8.0*  MG 2.0 2.0 2.1 2.0 2.0  PHOS UNABLE TO REPORT DUE TO ICTERUS  2.9 3.5 3.2 3.0 UNABLE TO REPORT DUE TO ICTERUS. QSD  2.8    Liver Function Tests: Recent Labs  Lab 06/24/19 0500  06/25/19 0608  06/27/19 0547  06/28/19 0540 06/28/19 1211 06/28/19 1809 06/28/19 2320 06/29/19 0517  AST 257*  --  317*  --  268*  --  246*  --   --   --  229*  ALT NOT VALID  --  UNABLE TO REPORT DUE TO ICTERUS  --   UNABLE TO REPORT DUE TO ICTERUS  --  UNABLE TO REPORT DUE TO ICTERUS  --   --   --  UNABLE TO REPORT DUE TO ICTERUS. QSD  ALKPHOS 164*  --  175*  --  207*  --  189*  --   --   --  175*  BILITOT 31.0*  --  31.1*  --  29.9*  --  29.5*  --   --   --  26.7*  PROT 5.4*  --  5.7*  --  5.7*  --  5.9*  --   --   --  5.4*  ALBUMIN 2.2*   < > 2.1*   < > 1.8*  1.9*   < > 1.9*  1.8* 1.8* 1.9* 1.7* 1.7*  1.7*   < > = values in this interval not displayed.   No results for input(s): LIPASE, AMYLASE in the last 168 hours. Recent Labs  Lab 06/26/2019 1715  AMMONIA 51*    CBC: Recent Labs  Lab 06/21/2019 1321  06/25/19 0608 06/26/19 0518 06/27/19 0547 06/28/19 0540 06/29/19 0517  WBC 17.0*   < > 11.9* 11.2* 12.7* 17.3* 20.2*  NEUTROABS 10.7*  --   --   --   --   --   --  HGB 7.1*   < > 14.1 14.0 14.0 14.4 13.9  HCT 20.5*   < > 38.7* 39.9 40.1 41.4 39.7  MCV 116.5*   < > 91.1 93.4 94.4 95.2 95.0  PLT 239   < > 96* 78* 62* 56* 57*   < > = values in this interval not displayed.    Cardiac Enzymes: No results for input(s): CKTOTAL, CKMB, CKMBINDEX, TROPONINI in the last 168 hours.  BNP: Invalid input(s): POCBNP  CBG: Recent Labs  Lab 06/29/19 0422 06/29/19 0748 06/29/19 1153 06/29/19 1624 06/29/19 1939  GLUCAP 140* 148* 176* 192* 179*    Microbiology: Results for orders placed or performed during the hospital encounter of 06/14/2019  Blood Culture (routine x 2)     Status: None   Collection Time: 06/21/2019 12:38 PM   Specimen: BLOOD  Result Value Ref Range Status   Specimen Description BLOOD LEFT ANTECUBITAL  Final   Special Requests   Final    BOTTLES DRAWN AEROBIC AND ANAEROBIC Blood Culture adequate volume   Culture   Final    NO GROWTH 5 DAYS Performed at [facility], [address] [place], [place] [address]    Report Status 06/28/2019 FINAL  Final  SARS Coronavirus 2 [facility] order, Performed in [facility] hospital lab) Nasopharyngeal Nasopharyngeal Swab      Status: None   Collection Time: 06/13/2019 12:58 PM   Specimen: Nasopharyngeal Swab  Result Value Ref Range Status   SARS Coronavirus 2 NEGATIVE NEGATIVE Final    Comment: (NOTE) If result is NEGATIVE SARS-CoV-2 target nucleic acids are NOT DETECTED. The SARS-CoV-2 RNA is generally detectable in upper and lower  respiratory specimens during the acute phase of infection. The lowest  concentration of SARS-CoV-2 viral copies this assay can detect is 250  copies / mL. A negative result does not preclude SARS-CoV-2 infection  and should not be used as the sole basis for treatment or other  patient management decisions.  A negative result may occur with  improper specimen collection / handling, submission of specimen other  than nasopharyngeal swab, presence of viral mutation(s) within the  areas targeted by this assay, and inadequate number of viral copies  (<250 copies / mL). A negative result must be combined with clinical  observations, patient history, and epidemiological information. If result is POSITIVE SARS-CoV-2 target nucleic acids are DETECTED. The SARS-CoV-2 RNA is generally detectable in upper and lower  respiratory specimens dur ing the acute phase of infection.  Positive  results are indicative of active infection with SARS-CoV-2.  Clinical  correlation with patient history and other diagnostic information is  necessary to determine patient infection status.  Positive results do  not rule out bacterial infection or co-infection with other viruses. If result is PRESUMPTIVE POSTIVE SARS-CoV-2 nucleic acids MAY BE PRESENT.   A presumptive positive result was obtained on the submitted specimen  and confirmed on repeat testing.  While 2019 novel coronavirus  (SARS-CoV-2) nucleic acids may be present in the submitted sample  additional confirmatory testing may be necessary for epidemiological  and / or clinical management purposes  to differentiate between  SARS-CoV-2 and other  Sarbecovirus currently known to infect humans.  If clinically indicated additional testing with an alternate test  methodology [phone]) is advised. The SARS-CoV-2 RNA is generally  detectable in upper and lower respiratory sp ecimens during the acute  phase of infection. The expected result is Negative. Fact Sheet for Patients:  StrictlyIdeas.no Fact Sheet for Healthcare Providers:  BankingDealers.co.za This test is not yet approved or cleared by the [place] and has been authorized for detection and/or diagnosis of SARS-CoV-2 by FDA under an Emergency Use Authorization (EUA).  This EUA will remain in effect (meaning this test can be used) for the duration of the COVID-19 declaration under Section 564(b)(1) of the Act, 21 U.S.C. section 360bbb-3(b)(1), unless the authorization is terminated or revoked sooner. Performed at [facility], [address] [place], [place] [address]   Blood Culture (routine x 2)     Status: None   Collection Time: 06/13/2019  6:59 PM   Specimen: BLOOD  Result Value Ref Range Status   Specimen Description BLOOD BLOOD RIGHT HAND  Final   Special Requests   Final    BOTTLES DRAWN AEROBIC AND ANAEROBIC Blood Culture results may not be optimal due to an inadequate volume of blood received in culture bottles   Culture   Final    NO GROWTH 5 DAYS Performed at [facility], [address] [place], [place] [address]    Report Status 06/28/2019 FINAL  Final  MRSA PCR Screening     Status: None   Collection Time: 06/26/19  9:13 AM   Specimen: Nasal Mucosa; Nasopharyngeal  Result Value Ref Range Status   MRSA by PCR NEGATIVE NEGATIVE Final    Comment:        The GeneXpert MRSA Assay (FDA approved for NASAL specimens only), is one component of a comprehensive MRSA colonization surveillance program. It is not intended to diagnose MRSA infection nor to guide or monitor treatment  for MRSA infections. Performed at [facility], [address] [place], [place] [address]   Culture, respiratory     Status: None   Collection Time: 06/26/19 11:52 AM   Specimen: Tracheal Aspirate; Respiratory  Result Value Ref Range Status   Specimen Description   Final    TRACHEAL ASPIRATE Performed at [facility], [address] [place], [place] [address]    Special Requests   Final    NONE Performed at [facility], [address] [place], [place] [address]    Gram Stain   Final    ABUNDANT WBC PRESENT,BOTH PMN AND MONONUCLEAR ABUNDANT GRAM NEGATIVE RODS MODERATE GRAM POSITIVE COCCI ABUNDANT YEAST Performed at [facility], [place] [address] [place], [place] [address]    Culture   Final    ABUNDANT STENOTROPHOMONAS MALTOPHILIA MODERATE SERRATIA MARCESCENS    Report Status 07-18-19 FINAL  Final   Organism ID, Bacteria STENOTROPHOMONAS MALTOPHILIA  Final   Organism ID, Bacteria SERRATIA MARCESCENS  Final      Susceptibility   Stenotrophomonas maltophilia - MIC*    LEVOFLOXACIN 0.5 SENSITIVE Sensitive     TRIMETH/SULFA <=20 SENSITIVE Sensitive     * ABUNDANT STENOTROPHOMONAS MALTOPHILIA   Serratia marcescens - MIC*    CEFAZOLIN >=64 RESISTANT Resistant     CEFEPIME <=1 SENSITIVE Sensitive     CEFTAZIDIME <=1 SENSITIVE Sensitive     CEFTRIAXONE <=1 SENSITIVE Sensitive     CIPROFLOXACIN <=0.25 SENSITIVE Sensitive     GENTAMICIN <=1 SENSITIVE Sensitive     TRIMETH/SULFA <=20 SENSITIVE Sensitive     * MODERATE SERRATIA MARCESCENS    Coagulation Studies: Recent Labs    06/28/19 0540  LABPROT 16.7*  INR 1.4*    Urinalysis: No results for input(s): COLORURINE, LABSPEC, PHURINE, GLUCOSEU, HGBUR, BILIRUBINUR, KETONESUR, PROTEINUR, UROBILINOGEN, NITRITE, LEUKOCYTESUR in the last 72 hours.  Invalid input(s): APPERANCEUR  Imaging: No results found.   Medications:   . sodium chloride Stopped (06/27/19 2004)  . feeding  supplement (VITAL HIGH PROTEIN) Stopped (2019/07/03 0806)  . morphine 5 mg/hr (07/03/2019 0600)  . norepinephrine (LEVOPHED) Adult infusion 25 mcg/min (Jul 03, 2019 0754)   . chlorhexidine gluconate (MEDLINE KIT)  15 mL Mouth Rinse BID  . Chlorhexidine Gluconate Cloth  6 each Topical Q0600  . mouth rinse  15 mL Mouth Rinse 10 times per day  . sodium chloride flush  10-40 mL Intracatheter Q12H  . sodium chloride flush  3 mL Intravenous Q12H   sodium chloride, albuterol, [DISCONTINUED] ondansetron **OR** ondansetron (ZOFRAN) IV, sodium chloride flush  Assessment/ Plan:  Trevor Lopez is a 63 y.o. white male with alcohol abuse, psoriasis, tubular adenoma of the colon,tobacco abusewho was [facility]2020for evaluation of severe GI bleeding and cardiac arrest. Started CRRT on 8/15 for acute renal failure  1. Acute renal failure with metabolic acidosis secondary to hypovolemic shock. Baseline creatinine of 0.95, normal GFR on 04/14/2016.  Anuric urine output. Requiring renal replacement therapy  2. Hypovolemic shock/hypotension due to GI bleed. Requiring vasopressors: norepinephrine  Weaned off vasopressin  3. Acute respiratory failure: requiring mechanical ventilation  4. Multiorgan failure with elevated MELD score. Overall prognosis is poor.   Will sign off.    LOS: 7 Sarath Kolluru Aug 24, 202010:43 AM

## 2019-07-11 NOTE — Progress Notes (Addendum)
1215 Patient made comfort care per family. 1230 Terminally extubated to room air. 1259 treated with Robinul .2 mg IV push for oral secretions and Ativan 1 mg for agitation. Family sharing memories and good times with each other to ease the tension of the situation.

## 2019-07-11 NOTE — Progress Notes (Signed)
Assisted tele visit to patient with sister, Konrad Dolores.  Maryelizabeth Rowan, RN

## 2019-07-11 NOTE — Care Plan (Signed)
Patient is extubated to room air 

## 2019-07-11 NOTE — Progress Notes (Signed)
[place] [facility] [place] NAME: Trevor Lopez    MR#:  [id number]  DATE OF BIRTH:  [birth year]  SUBJECTIVE:  CHIEF COMPLAINT:   Chief Complaint  Patient presents with  . GI Bleeding   Patient's critically ill. REVIEW OF SYSTEMS:    Review of Systems  Unable to perform ROS: Intubated   DRUG ALLERGIES:  No Known Allergies  VITALS:  Blood pressure (!) 81/56, pulse (!) 105, temperature (!) 100.6 F (38.1 C), resp. rate 12, height 5\' 4"  (1.626 m), weight 87.7 kg, SpO2 90 %.  PHYSICAL EXAMINATION:   Physical Exam  GENERAL:  63 y.o.-year-old patient lying in the bed. Sedated. ETT.  Jaundiced EYES: Pupils equal, round, reactive to light  +scleral icterus. Extraocular muscles intact.  HEENT: Head atraumatic, normocephalic. Oropharynx and nasopharynx clear.  NECK:  Supple, no jugular venous distention. No thyroid enlargement, no tenderness.  LUNGS: Normal breath sounds bilaterally.  CARDIOVASCULAR: S1, S2 normal. No murmurs, rubs, or gallops.  ABDOMEN: Soft, nontender, nondistended. Bowel sounds present. No organomegaly or mass.  EXTREMITIES: No cyanosis, clubbing or edema b/l.    PSYCHIATRIC: The patient is sedated. SKIN: No obvious rash, lesion, or ulcer.   LABORATORY PANEL:   CBC Recent Labs  Lab 06/29/19 0517  WBC 20.2*  HGB 13.9  HCT 39.7  PLT 57*   ------------------------------------------------------------------------------------------------------------------ Chemistries  Recent Labs  Lab 06/29/19 0517  NA 136  K 4.9  CL 103  CO2 25  GLUCOSE 153*  BUN 30*  CREATININE 0.90  CALCIUM 8.0*  MG 2.0  AST 229*  ALT UNABLE TO REPORT DUE TO ICTERUS. QSD  ALKPHOS 175*  BILITOT 26.7*   ------------------------------------------------------------------------------------------------------------------  Cardiac Enzymes No results for input(s): TROPONINI in the last 168  hours. ------------------------------------------------------------------------------------------------------------------  RADIOLOGY:  No results found.   ASSESSMENT AND PLAN:   * Upper GI bleed likely variceal bleed Bleeding has stopped at this time. Protonix and octreotide drip stopped.  Now on Protonix IV twice daily GI following patient Hemoglobin stable.  Continue PPIs.  GI on case.  Patient has multiorgan failure with acute liver failure, renal failure, severe alcoholic hepatitis with upper GI bleed, respiratory failure, patient prognosis poor, now DNR.  Family leaning to comfort measures today.  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 Monitor For prognosis  *Cardiac arrest secondary to hypovolemic shock. Presently intubated.  Continue pressors. *Acute kidney injury with hyperkalemia.   On CRRT  Poor prognosis with multiorgan failure at this time.  Palliative care consulted, comfort measures to be initiated tomorrow as per wife wishes.  All the records are reviewed and case discussed with Care Management/Social Worker Management plans discussed with the patient, family and they are in agreement.  CODE STATUS: DNR  DVT Prophylaxis: SCDs  TOTAL TIME TAKING CARE OF THIS PATIENT: 35 minutes.   Epifanio Lesches M.D on 07/09/19 at 12:11 PM  Between 7am to 6pm - Pager - [phone]  After 6pm go to [email or web address] - password [email or web address] [facility]  Office  [phone]  CC: Primary care physician; Guadalupe Maple, MD  Note: This dictation was prepared with Dragon dictation along with smaller phrase technology. Any transcriptional errors that result from this process are unintentional.

## 2019-07-11 NOTE — Progress Notes (Signed)
1327 Patient died with family present.

## 2019-07-11 NOTE — Progress Notes (Signed)
Noted plan to perform comfort care measures from today.  I will sign off please call with any further questions.  Dr Jonathon Bellows MD,MRCP [facility]) Gastroenterology/Hepatology Pager: [phone]

## 2019-07-11 DEATH — deceased

## 2021-06-08 IMAGING — DX PORTABLE CHEST - 1 VIEW
1 series · 1 of 1 positions shown · non-contrast
Comparison: Yesterday

CLINICAL DATA: Elevated liver function tests

EXAM:
PORTABLE CHEST 1 VIEW

[chest ap]
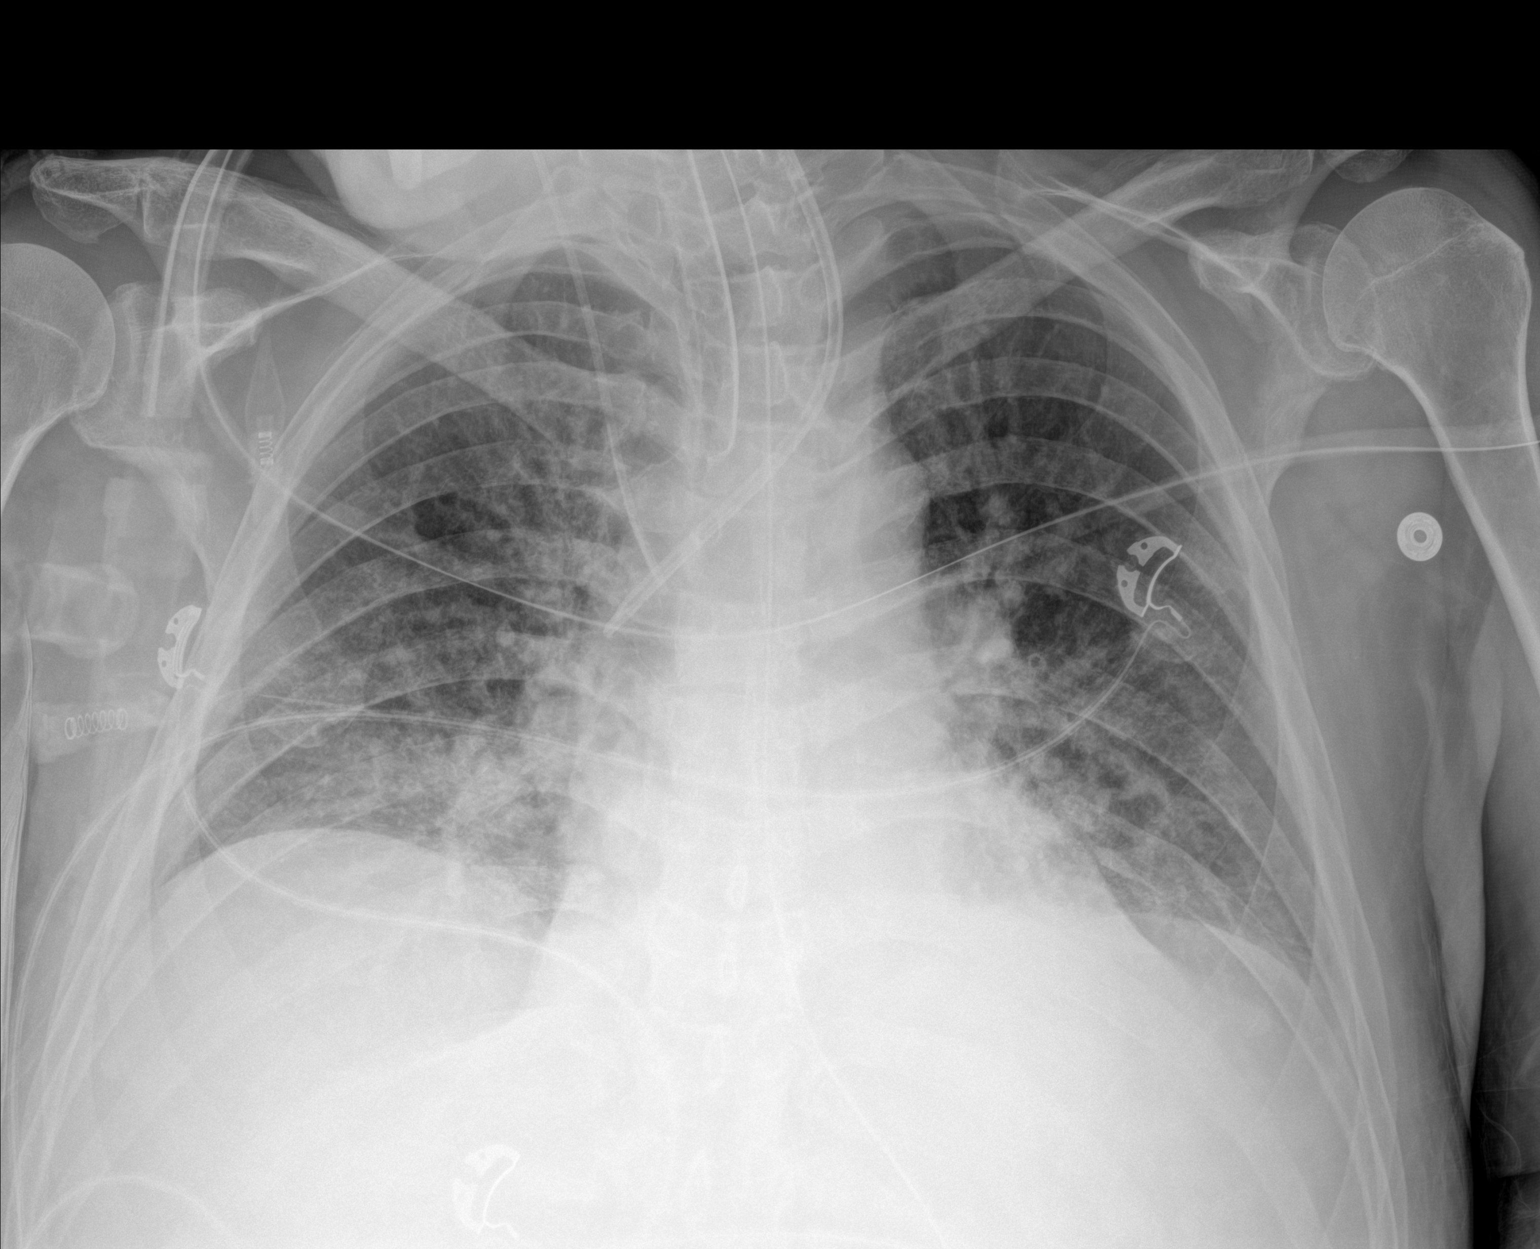

[1 of 1 positions shown; findings below may reference images not displayed]

FINDINGS: Endotracheal tube tip at the clavicular heads. Bilateral IJ line
with tips at the SVC. The orogastric tube reaches the stomach at
least. Low volume chest with interstitial crowding/atelectasis.
Stable heart size. No pneumothorax.
IMPRESSION: Stable hardware positioning and low volume chest with basilar
opacification.
